# Patient Record
Sex: Female | Born: 1970 | Race: White | Hispanic: No | Marital: Married | Smoking: Current some day smoker
Health system: Southern US, Community
[De-identification: ages and names within clinical notes are randomized; demographics above are authoritative.]

## PROBLEM LIST (undated history)

## (undated) DIAGNOSIS — D649 Anemia, unspecified: Secondary | ICD-10-CM

## (undated) DIAGNOSIS — K209 Esophagitis, unspecified without bleeding: Secondary | ICD-10-CM

## (undated) DIAGNOSIS — K219 Gastro-esophageal reflux disease without esophagitis: Secondary | ICD-10-CM

## (undated) DIAGNOSIS — C50919 Malignant neoplasm of unspecified site of unspecified female breast: Secondary | ICD-10-CM

## (undated) DIAGNOSIS — Z923 Personal history of irradiation: Secondary | ICD-10-CM

## (undated) DIAGNOSIS — F419 Anxiety disorder, unspecified: Secondary | ICD-10-CM

## (undated) HISTORY — PX: UPPER GI ENDOSCOPY: SHX6162

## (undated) HISTORY — PX: WRIST GANGLION EXCISION: SUR520

## (undated) HISTORY — DX: Anemia, unspecified: D64.9

## (undated) HISTORY — DX: Anxiety disorder, unspecified: F41.9

---

## 1999-11-03 ENCOUNTER — Other Ambulatory Visit: Admission: RE | Admit: 1999-11-03 | Discharge: 1999-11-03 | Payer: Self-pay | Admitting: *Deleted

## 1999-12-06 ENCOUNTER — Encounter: Admission: RE | Admit: 1999-12-06 | Discharge: 2000-01-25 | Payer: Self-pay | Admitting: Internal Medicine

## 2000-11-25 ENCOUNTER — Other Ambulatory Visit: Admission: RE | Admit: 2000-11-25 | Discharge: 2000-11-25 | Payer: Self-pay | Admitting: Obstetrics and Gynecology

## 2002-02-11 ENCOUNTER — Other Ambulatory Visit: Admission: RE | Admit: 2002-02-11 | Discharge: 2002-02-11 | Payer: Self-pay | Admitting: Obstetrics and Gynecology

## 2002-11-09 ENCOUNTER — Ambulatory Visit (HOSPITAL_COMMUNITY): Admission: RE | Admit: 2002-11-09 | Discharge: 2002-11-09 | Payer: Self-pay | Admitting: Obstetrics and Gynecology

## 2003-10-27 ENCOUNTER — Encounter: Admission: RE | Admit: 2003-10-27 | Discharge: 2003-10-27 | Payer: Self-pay | Admitting: Family Medicine

## 2004-02-17 ENCOUNTER — Ambulatory Visit: Payer: Self-pay | Admitting: Family Medicine

## 2004-04-25 ENCOUNTER — Ambulatory Visit: Payer: Self-pay | Admitting: Family Medicine

## 2004-07-07 ENCOUNTER — Ambulatory Visit: Payer: Self-pay | Admitting: Family Medicine

## 2004-10-09 ENCOUNTER — Ambulatory Visit: Payer: Self-pay | Admitting: Family Medicine

## 2004-10-16 ENCOUNTER — Encounter: Admission: RE | Admit: 2004-10-16 | Discharge: 2004-10-16 | Payer: Self-pay | Admitting: Family Medicine

## 2004-11-08 ENCOUNTER — Ambulatory Visit (HOSPITAL_COMMUNITY): Admission: RE | Admit: 2004-11-08 | Discharge: 2004-11-08 | Payer: Self-pay | Admitting: *Deleted

## 2005-09-06 ENCOUNTER — Ambulatory Visit: Payer: Self-pay | Admitting: Family Medicine

## 2005-10-24 ENCOUNTER — Ambulatory Visit: Payer: Self-pay | Admitting: Family Medicine

## 2005-10-24 ENCOUNTER — Ambulatory Visit: Payer: Self-pay | Admitting: *Deleted

## 2005-10-24 LAB — CONVERTED CEMR LAB
ALT: 11 units/L (ref 0–40)
AST: 15 units/L (ref 0–37)
Albumin: 4.1 g/dL (ref 3.5–5.2)
Alkaline Phosphatase: 52 units/L (ref 39–117)
Amylase: 63 units/L (ref 27–131)
BUN: 8 mg/dL (ref 6–23)
Basophils Absolute: 0 10*3/uL (ref 0.0–0.1)
Basophils Relative: 0.7 % (ref 0.0–1.0)
CO2: 28 meq/L (ref 19–32)
Calcium: 9.2 mg/dL (ref 8.4–10.5)
Chloride: 104 meq/L (ref 96–112)
Creatinine, Ser: 0.6 mg/dL (ref 0.4–1.2)
Eosinophil percent: 2.5 % (ref 0.0–5.0)
GFR calc non Af Amer: 121 mL/min
Glomerular Filtration Rate, Af Am: 146 mL/min/{1.73_m2}
Glucose, Bld: 86 mg/dL (ref 70–99)
HCT: 36.5 % (ref 36.0–46.0)
Hemoglobin: 12.1 g/dL (ref 12.0–15.0)
Lipase: 23 units/L (ref 11.0–59.0)
Lymphocytes Relative: 37 % (ref 12.0–46.0)
MCHC: 33.2 g/dL (ref 30.0–36.0)
MCV: 91.6 fL (ref 78.0–100.0)
Monocytes Absolute: 0.3 10*3/uL (ref 0.2–0.7)
Monocytes Relative: 5.6 % (ref 3.0–11.0)
Neutro Abs: 2.9 10*3/uL (ref 1.4–7.7)
Neutrophils Relative %: 54.2 % (ref 43.0–77.0)
Platelets: 328 10*3/uL (ref 150–400)
Potassium: 4 meq/L (ref 3.5–5.1)
RBC: 3.99 M/uL (ref 3.87–5.11)
RDW: 12.6 % (ref 11.5–14.6)
Sodium: 137 meq/L (ref 135–145)
Total Bilirubin: 0.7 mg/dL (ref 0.3–1.2)
Total Protein: 6.7 g/dL (ref 6.0–8.3)
WBC: 5.3 10*3/uL (ref 4.5–10.5)

## 2005-12-10 ENCOUNTER — Ambulatory Visit: Payer: Self-pay | Admitting: Family Medicine

## 2006-01-24 ENCOUNTER — Ambulatory Visit: Payer: Self-pay | Admitting: Family Medicine

## 2006-01-24 LAB — CONVERTED CEMR LAB
ALT: 12 units/L (ref 0–40)
AST: 19 units/L (ref 0–37)
Albumin: 4 g/dL (ref 3.5–5.2)
Alkaline Phosphatase: 52 units/L (ref 39–117)
BUN: 8 mg/dL (ref 6–23)
Basophils Absolute: 0.1 10*3/uL (ref 0.0–0.1)
Basophils Relative: 1.8 % — ABNORMAL HIGH (ref 0.0–1.0)
CO2: 29 meq/L (ref 19–32)
Calcium: 9.2 mg/dL (ref 8.4–10.5)
Chloride: 107 meq/L (ref 96–112)
Creatinine, Ser: 0.6 mg/dL (ref 0.4–1.2)
Eosinophils Absolute: 0.1 10*3/uL (ref 0.0–0.6)
Eosinophils Relative: 2.1 % (ref 0.0–5.0)
GFR calc Af Amer: 146 mL/min
GFR calc non Af Amer: 121 mL/min
Glucose, Bld: 78 mg/dL (ref 70–99)
HCT: 36.9 % (ref 36.0–46.0)
Hemoglobin: 12.8 g/dL (ref 12.0–15.0)
Lymphocytes Relative: 36.2 % (ref 12.0–46.0)
MCHC: 34.8 g/dL (ref 30.0–36.0)
MCV: 92 fL (ref 78.0–100.0)
Monocytes Absolute: 0.2 10*3/uL (ref 0.2–0.7)
Monocytes Relative: 3.5 % (ref 3.0–11.0)
Neutro Abs: 2.8 10*3/uL (ref 1.4–7.7)
Neutrophils Relative %: 56.4 % (ref 43.0–77.0)
Platelets: 343 10*3/uL (ref 150–400)
Potassium: 4.7 meq/L (ref 3.5–5.1)
RBC: 4.02 M/uL (ref 3.87–5.11)
RDW: 12.7 % (ref 11.5–14.6)
Sodium: 141 meq/L (ref 135–145)
Total Bilirubin: 0.5 mg/dL (ref 0.3–1.2)
Total Protein: 6.7 g/dL (ref 6.0–8.3)
WBC: 5 10*3/uL (ref 4.5–10.5)

## 2006-08-22 ENCOUNTER — Telehealth (INDEPENDENT_AMBULATORY_CARE_PROVIDER_SITE_OTHER): Payer: Self-pay | Admitting: *Deleted

## 2006-08-23 ENCOUNTER — Ambulatory Visit: Payer: Self-pay | Admitting: Family Medicine

## 2006-08-23 DIAGNOSIS — J019 Acute sinusitis, unspecified: Secondary | ICD-10-CM

## 2006-08-26 ENCOUNTER — Telehealth (INDEPENDENT_AMBULATORY_CARE_PROVIDER_SITE_OTHER): Payer: Self-pay | Admitting: *Deleted

## 2006-09-18 LAB — CONVERTED CEMR LAB: Pap Smear: NORMAL

## 2007-02-20 ENCOUNTER — Telehealth (INDEPENDENT_AMBULATORY_CARE_PROVIDER_SITE_OTHER): Payer: Self-pay | Admitting: *Deleted

## 2007-02-21 ENCOUNTER — Ambulatory Visit: Payer: Self-pay | Admitting: Family Medicine

## 2007-02-21 DIAGNOSIS — R42 Dizziness and giddiness: Secondary | ICD-10-CM | POA: Insufficient documentation

## 2007-02-24 ENCOUNTER — Telehealth: Payer: Self-pay | Admitting: Family Medicine

## 2007-02-24 ENCOUNTER — Encounter (INDEPENDENT_AMBULATORY_CARE_PROVIDER_SITE_OTHER): Payer: Self-pay | Admitting: *Deleted

## 2007-02-24 ENCOUNTER — Ambulatory Visit: Payer: Self-pay | Admitting: Family Medicine

## 2007-02-24 DIAGNOSIS — D518 Other vitamin B12 deficiency anemias: Secondary | ICD-10-CM

## 2007-02-24 LAB — CONVERTED CEMR LAB
BUN: 5 mg/dL — ABNORMAL LOW (ref 6–23)
Basophils Absolute: 0.1 10*3/uL (ref 0.0–0.1)
Basophils Relative: 1.5 % — ABNORMAL HIGH (ref 0.0–1.0)
CO2: 30 meq/L (ref 19–32)
Calcium: 9.2 mg/dL (ref 8.4–10.5)
Chloride: 107 meq/L (ref 96–112)
Creatinine, Ser: 0.6 mg/dL (ref 0.4–1.2)
Eosinophils Absolute: 0.1 10*3/uL (ref 0.0–0.6)
Eosinophils Relative: 2.5 % (ref 0.0–5.0)
Folate: 10.8 ng/mL
GFR calc Af Amer: 145 mL/min
GFR calc non Af Amer: 120 mL/min
Glucose, Bld: 89 mg/dL (ref 70–99)
HCT: 36 % (ref 36.0–46.0)
Hemoglobin: 11.9 g/dL — ABNORMAL LOW (ref 12.0–15.0)
Lymphocytes Relative: 42.4 % (ref 12.0–46.0)
MCHC: 33.1 g/dL (ref 30.0–36.0)
MCV: 92.7 fL (ref 78.0–100.0)
Monocytes Absolute: 0.3 10*3/uL (ref 0.2–0.7)
Monocytes Relative: 5.5 % (ref 3.0–11.0)
Neutro Abs: 2.3 10*3/uL (ref 1.4–7.7)
Neutrophils Relative %: 48.1 % (ref 43.0–77.0)
Platelets: 345 10*3/uL (ref 150–400)
Potassium: 4.2 meq/L (ref 3.5–5.1)
RBC: 3.89 M/uL (ref 3.87–5.11)
RDW: 12.5 % (ref 11.5–14.6)
Sodium: 142 meq/L (ref 135–145)
TSH: 0.8 microintl units/mL (ref 0.35–5.50)
Vitamin B-12: 184 pg/mL — ABNORMAL LOW (ref 211–911)
WBC: 4.8 10*3/uL (ref 4.5–10.5)

## 2007-02-27 ENCOUNTER — Telehealth: Payer: Self-pay | Admitting: Family Medicine

## 2007-03-04 ENCOUNTER — Ambulatory Visit: Payer: Self-pay | Admitting: Family Medicine

## 2007-03-04 DIAGNOSIS — R519 Headache, unspecified: Secondary | ICD-10-CM | POA: Insufficient documentation

## 2007-03-04 DIAGNOSIS — R51 Headache: Secondary | ICD-10-CM

## 2007-03-07 ENCOUNTER — Encounter (INDEPENDENT_AMBULATORY_CARE_PROVIDER_SITE_OTHER): Payer: Self-pay | Admitting: Family Medicine

## 2007-03-08 ENCOUNTER — Encounter: Admission: RE | Admit: 2007-03-08 | Discharge: 2007-03-08 | Payer: Self-pay | Admitting: Family Medicine

## 2007-03-10 ENCOUNTER — Telehealth: Payer: Self-pay | Admitting: Family Medicine

## 2007-03-11 ENCOUNTER — Encounter: Payer: Self-pay | Admitting: Family Medicine

## 2007-03-13 ENCOUNTER — Ambulatory Visit: Payer: Self-pay | Admitting: Family Medicine

## 2007-03-13 ENCOUNTER — Telehealth (INDEPENDENT_AMBULATORY_CARE_PROVIDER_SITE_OTHER): Payer: Self-pay | Admitting: *Deleted

## 2007-03-14 ENCOUNTER — Encounter (INDEPENDENT_AMBULATORY_CARE_PROVIDER_SITE_OTHER): Payer: Self-pay | Admitting: *Deleted

## 2007-03-17 ENCOUNTER — Telehealth (INDEPENDENT_AMBULATORY_CARE_PROVIDER_SITE_OTHER): Payer: Self-pay | Admitting: *Deleted

## 2007-03-17 ENCOUNTER — Encounter (INDEPENDENT_AMBULATORY_CARE_PROVIDER_SITE_OTHER): Payer: Self-pay | Admitting: *Deleted

## 2007-03-18 ENCOUNTER — Telehealth (INDEPENDENT_AMBULATORY_CARE_PROVIDER_SITE_OTHER): Payer: Self-pay | Admitting: *Deleted

## 2007-03-18 ENCOUNTER — Encounter (INDEPENDENT_AMBULATORY_CARE_PROVIDER_SITE_OTHER): Payer: Self-pay | Admitting: *Deleted

## 2007-03-18 ENCOUNTER — Telehealth: Payer: Self-pay | Admitting: Family Medicine

## 2007-03-19 ENCOUNTER — Telehealth (INDEPENDENT_AMBULATORY_CARE_PROVIDER_SITE_OTHER): Payer: Self-pay | Admitting: *Deleted

## 2007-03-24 ENCOUNTER — Ambulatory Visit: Payer: Self-pay | Admitting: Family Medicine

## 2007-03-26 ENCOUNTER — Telehealth (INDEPENDENT_AMBULATORY_CARE_PROVIDER_SITE_OTHER): Payer: Self-pay | Admitting: *Deleted

## 2007-03-27 ENCOUNTER — Encounter (INDEPENDENT_AMBULATORY_CARE_PROVIDER_SITE_OTHER): Payer: Self-pay | Admitting: *Deleted

## 2007-03-27 ENCOUNTER — Telehealth (INDEPENDENT_AMBULATORY_CARE_PROVIDER_SITE_OTHER): Payer: Self-pay | Admitting: *Deleted

## 2007-03-27 LAB — CONVERTED CEMR LAB
Basophils Absolute: 0 10*3/uL (ref 0.0–0.1)
Basophils Relative: 0.1 % (ref 0.0–1.0)
Eosinophils Absolute: 0.2 10*3/uL (ref 0.0–0.6)
Eosinophils Relative: 3.6 % (ref 0.0–5.0)
Ferritin: 23.3 ng/mL (ref 10.0–291.0)
Folate: 9.5 ng/mL
HCT: 37 % (ref 36.0–46.0)
Hemoglobin: 12.2 g/dL (ref 12.0–15.0)
Iron: 99 ug/dL (ref 42–145)
Lymphocytes Relative: 44.9 % (ref 12.0–46.0)
MCHC: 33 g/dL (ref 30.0–36.0)
MCV: 93.1 fL (ref 78.0–100.0)
Monocytes Absolute: 0.2 10*3/uL (ref 0.2–0.7)
Monocytes Relative: 4 % (ref 3.0–11.0)
Neutro Abs: 2.5 10*3/uL (ref 1.4–7.7)
Neutrophils Relative %: 47.4 % (ref 43.0–77.0)
Platelets: 358 10*3/uL (ref 150–400)
RBC: 3.98 M/uL (ref 3.87–5.11)
RDW: 12.9 % (ref 11.5–14.6)
Saturation Ratios: 27.5 % (ref 20.0–50.0)
TSH: 1.16 microintl units/mL (ref 0.35–5.50)
Transferrin: 257.1 mg/dL (ref >=212.0)
Vitamin B-12: 462 pg/mL (ref 211–911)
WBC: 5.2 10*3/uL (ref 4.5–10.5)

## 2007-03-31 ENCOUNTER — Encounter: Payer: Self-pay | Admitting: Family Medicine

## 2007-04-02 ENCOUNTER — Telehealth (INDEPENDENT_AMBULATORY_CARE_PROVIDER_SITE_OTHER): Payer: Self-pay | Admitting: *Deleted

## 2007-04-02 LAB — CONVERTED CEMR LAB
Anti Nuclear Antibody(ANA): NEGATIVE
Vit D, 1,25-Dihydroxy: 18 — ABNORMAL LOW (ref 30–89)

## 2007-04-03 ENCOUNTER — Encounter: Payer: Self-pay | Admitting: Family Medicine

## 2007-04-03 ENCOUNTER — Telehealth: Payer: Self-pay | Admitting: Family Medicine

## 2007-04-08 ENCOUNTER — Encounter: Payer: Self-pay | Admitting: Family Medicine

## 2007-04-10 ENCOUNTER — Ambulatory Visit: Payer: Self-pay | Admitting: Family Medicine

## 2007-04-10 DIAGNOSIS — F411 Generalized anxiety disorder: Secondary | ICD-10-CM | POA: Insufficient documentation

## 2007-04-10 DIAGNOSIS — E559 Vitamin D deficiency, unspecified: Secondary | ICD-10-CM | POA: Insufficient documentation

## 2007-04-29 ENCOUNTER — Ambulatory Visit: Payer: Self-pay | Admitting: Family Medicine

## 2007-05-29 ENCOUNTER — Telehealth (INDEPENDENT_AMBULATORY_CARE_PROVIDER_SITE_OTHER): Payer: Self-pay | Admitting: *Deleted

## 2007-05-30 ENCOUNTER — Ambulatory Visit: Payer: Self-pay | Admitting: Family Medicine

## 2007-06-04 LAB — CONVERTED CEMR LAB: Vitamin B-12: 313 pg/mL (ref 211–911)

## 2007-06-05 ENCOUNTER — Encounter (INDEPENDENT_AMBULATORY_CARE_PROVIDER_SITE_OTHER): Payer: Self-pay | Admitting: *Deleted

## 2007-06-20 ENCOUNTER — Ambulatory Visit: Payer: Self-pay | Admitting: Family Medicine

## 2007-06-24 ENCOUNTER — Telehealth: Payer: Self-pay | Admitting: Family Medicine

## 2007-07-01 ENCOUNTER — Telehealth (INDEPENDENT_AMBULATORY_CARE_PROVIDER_SITE_OTHER): Payer: Self-pay | Admitting: *Deleted

## 2007-08-07 ENCOUNTER — Ambulatory Visit: Payer: Self-pay | Admitting: Family Medicine

## 2007-08-08 ENCOUNTER — Encounter: Payer: Self-pay | Admitting: Family Medicine

## 2007-08-11 ENCOUNTER — Telehealth (INDEPENDENT_AMBULATORY_CARE_PROVIDER_SITE_OTHER): Payer: Self-pay | Admitting: *Deleted

## 2007-08-11 LAB — CONVERTED CEMR LAB: Vit D, 1,25-Dihydroxy: 36 (ref 30–89)

## 2007-08-17 LAB — CONVERTED CEMR LAB
ALT: 14 units/L (ref 0–35)
AST: 19 units/L (ref 0–37)
Albumin: 4.5 g/dL (ref 3.5–5.2)
Alkaline Phosphatase: 58 units/L (ref 39–117)
BUN: 10 mg/dL (ref 6–23)
Basophils Absolute: 0.1 10*3/uL (ref 0.0–0.1)
Basophils Relative: 1.2 % (ref 0.0–3.0)
Bilirubin, Direct: 0.1 mg/dL (ref 0.0–0.3)
CO2: 27 meq/L (ref 19–32)
Calcium: 9.5 mg/dL (ref 8.4–10.5)
Chloride: 103 meq/L (ref 96–112)
Cholesterol: 197 mg/dL (ref 0–200)
Creatinine, Ser: 0.6 mg/dL (ref 0.4–1.2)
Eosinophils Absolute: 0.1 10*3/uL (ref 0.0–0.7)
Eosinophils Relative: 2.4 % (ref 0.0–5.0)
Folate: 13.5 ng/mL
GFR calc Af Amer: 145 mL/min
GFR calc non Af Amer: 120 mL/min
Glucose, Bld: 79 mg/dL (ref 70–99)
HCT: 35.8 % — ABNORMAL LOW (ref 36.0–46.0)
HDL: 49.1 mg/dL (ref >39.0)
Hemoglobin: 12.6 g/dL (ref 12.0–15.0)
LDL Cholesterol: 121 mg/dL — ABNORMAL HIGH (ref 0–99)
Lymphocytes Relative: 39.3 % (ref 12.0–46.0)
MCHC: 35.1 g/dL (ref 30.0–36.0)
MCV: 92.8 fL (ref 78.0–100.0)
Monocytes Absolute: 0.4 10*3/uL (ref 0.1–1.0)
Monocytes Relative: 7.1 % (ref 3.0–12.0)
Neutro Abs: 2.7 10*3/uL (ref 1.4–7.7)
Neutrophils Relative %: 50 % (ref 43.0–77.0)
Platelets: 344 10*3/uL (ref 150–400)
Potassium: 4.1 meq/L (ref 3.5–5.1)
RBC: 3.86 M/uL — ABNORMAL LOW (ref 3.87–5.11)
RDW: 12.6 % (ref 11.5–14.6)
Sodium: 139 meq/L (ref 135–145)
TSH: 1.79 microintl units/mL (ref 0.35–5.50)
Total Bilirubin: 0.9 mg/dL (ref 0.3–1.2)
Total CHOL/HDL Ratio: 4
Total Protein: 7.4 g/dL (ref 6.0–8.3)
Triglycerides: 134 mg/dL (ref 0–149)
VLDL: 27 mg/dL (ref 0–40)
Vitamin B-12: >1500 pg/mL — ABNORMAL HIGH (ref 211–911)
WBC: 5.5 10*3/uL (ref 4.5–10.5)

## 2007-08-18 ENCOUNTER — Encounter (INDEPENDENT_AMBULATORY_CARE_PROVIDER_SITE_OTHER): Payer: Self-pay | Admitting: *Deleted

## 2007-09-19 ENCOUNTER — Ambulatory Visit: Payer: Self-pay | Admitting: Family Medicine

## 2007-10-27 ENCOUNTER — Ambulatory Visit: Payer: Self-pay | Admitting: Family Medicine

## 2007-12-10 ENCOUNTER — Ambulatory Visit: Payer: Self-pay | Admitting: Family Medicine

## 2008-02-16 ENCOUNTER — Ambulatory Visit: Payer: Self-pay | Admitting: Family Medicine

## 2008-04-26 ENCOUNTER — Ambulatory Visit: Payer: Self-pay | Admitting: Family Medicine

## 2008-06-01 ENCOUNTER — Ambulatory Visit: Payer: Self-pay | Admitting: Family Medicine

## 2008-06-15 ENCOUNTER — Ambulatory Visit: Payer: Self-pay | Admitting: Family Medicine

## 2008-06-15 DIAGNOSIS — R1031 Right lower quadrant pain: Secondary | ICD-10-CM

## 2008-06-15 DIAGNOSIS — M533 Sacrococcygeal disorders, not elsewhere classified: Secondary | ICD-10-CM | POA: Insufficient documentation

## 2008-06-16 ENCOUNTER — Telehealth (INDEPENDENT_AMBULATORY_CARE_PROVIDER_SITE_OTHER): Payer: Self-pay | Admitting: *Deleted

## 2008-06-16 LAB — CONVERTED CEMR LAB
ALT: 15 units/L (ref 0–35)
AST: 17 units/L (ref 0–37)
Albumin: 4.2 g/dL (ref 3.5–5.2)
Alkaline Phosphatase: 50 units/L (ref 39–117)
BUN: 10 mg/dL (ref 6–23)
Basophils Absolute: 0 10*3/uL (ref 0.0–0.1)
Basophils Relative: 0.4 % (ref 0.0–3.0)
Bilirubin, Direct: 0.1 mg/dL (ref 0.0–0.3)
CO2: 26 meq/L (ref 19–32)
Calcium: 9 mg/dL (ref 8.4–10.5)
Chloride: 106 meq/L (ref 96–112)
Creatinine, Ser: 0.6 mg/dL (ref 0.4–1.2)
Eosinophils Absolute: 0.1 10*3/uL (ref 0.0–0.7)
Eosinophils Relative: 2.2 % (ref 0.0–5.0)
GFR calc non Af Amer: 119.05 mL/min (ref >60)
Glucose, Bld: 78 mg/dL (ref 70–99)
HCT: 37 % (ref 36.0–46.0)
Hemoglobin: 12.7 g/dL (ref 12.0–15.0)
Lymphocytes Relative: 35.2 % (ref 12.0–46.0)
Lymphs Abs: 2.4 10*3/uL (ref 0.7–4.0)
MCHC: 34.3 g/dL (ref 30.0–36.0)
MCV: 96.3 fL (ref 78.0–100.0)
Monocytes Absolute: 0.5 10*3/uL (ref 0.1–1.0)
Monocytes Relative: 7.3 % (ref 3.0–12.0)
Neutro Abs: 3.8 10*3/uL (ref 1.4–7.7)
Neutrophils Relative %: 54.9 % (ref 43.0–77.0)
Platelets: 255 10*3/uL (ref 150.0–400.0)
Potassium: 4.9 meq/L (ref 3.5–5.1)
RBC: 3.84 M/uL — ABNORMAL LOW (ref 3.87–5.11)
RDW: 12.6 % (ref 11.5–14.6)
Sodium: 138 meq/L (ref 135–145)
Total Bilirubin: 0.7 mg/dL (ref 0.3–1.2)
Total Protein: 7.1 g/dL (ref 6.0–8.3)
WBC: 6.8 10*3/uL (ref 4.5–10.5)

## 2008-07-26 ENCOUNTER — Ambulatory Visit: Payer: Self-pay | Admitting: Family Medicine

## 2008-08-24 ENCOUNTER — Ambulatory Visit: Payer: Self-pay | Admitting: Family Medicine

## 2008-10-18 ENCOUNTER — Telehealth (INDEPENDENT_AMBULATORY_CARE_PROVIDER_SITE_OTHER): Payer: Self-pay | Admitting: *Deleted

## 2008-10-18 ENCOUNTER — Ambulatory Visit: Payer: Self-pay | Admitting: Radiology

## 2008-10-18 ENCOUNTER — Telehealth: Payer: Self-pay | Admitting: Family Medicine

## 2008-10-18 ENCOUNTER — Emergency Department (HOSPITAL_BASED_OUTPATIENT_CLINIC_OR_DEPARTMENT_OTHER): Admission: EM | Admit: 2008-10-18 | Discharge: 2008-10-18 | Payer: Self-pay | Admitting: Emergency Medicine

## 2008-10-19 ENCOUNTER — Ambulatory Visit: Payer: Self-pay | Admitting: Family Medicine

## 2009-01-31 ENCOUNTER — Ambulatory Visit: Payer: Self-pay | Admitting: Family

## 2009-01-31 DIAGNOSIS — H6690 Otitis media, unspecified, unspecified ear: Secondary | ICD-10-CM | POA: Insufficient documentation

## 2009-01-31 LAB — CONVERTED CEMR LAB: Rapid Strep: NEGATIVE

## 2009-03-31 ENCOUNTER — Ambulatory Visit: Payer: Self-pay | Admitting: Family Medicine

## 2009-07-19 ENCOUNTER — Ambulatory Visit: Payer: Self-pay | Admitting: Family Medicine

## 2009-07-29 ENCOUNTER — Ambulatory Visit: Payer: Self-pay | Admitting: Family Medicine

## 2009-07-29 DIAGNOSIS — R040 Epistaxis: Secondary | ICD-10-CM

## 2009-07-29 DIAGNOSIS — R079 Chest pain, unspecified: Secondary | ICD-10-CM | POA: Insufficient documentation

## 2009-08-03 ENCOUNTER — Telehealth (INDEPENDENT_AMBULATORY_CARE_PROVIDER_SITE_OTHER): Payer: Self-pay | Admitting: *Deleted

## 2009-08-04 ENCOUNTER — Ambulatory Visit: Payer: Self-pay | Admitting: Family Medicine

## 2009-08-18 ENCOUNTER — Ambulatory Visit: Payer: Self-pay | Admitting: Family Medicine

## 2009-08-31 ENCOUNTER — Ambulatory Visit: Payer: Self-pay | Admitting: Family Medicine

## 2009-09-15 ENCOUNTER — Ambulatory Visit: Payer: Self-pay | Admitting: Family Medicine

## 2009-09-28 ENCOUNTER — Ambulatory Visit: Payer: Self-pay | Admitting: Family Medicine

## 2009-10-14 ENCOUNTER — Ambulatory Visit: Payer: Self-pay | Admitting: Family Medicine

## 2009-11-02 ENCOUNTER — Ambulatory Visit: Payer: Self-pay | Admitting: Family Medicine

## 2009-11-22 ENCOUNTER — Ambulatory Visit: Payer: Self-pay | Admitting: Family Medicine

## 2009-12-20 ENCOUNTER — Ambulatory Visit: Payer: Self-pay | Admitting: Family Medicine

## 2009-12-23 ENCOUNTER — Telehealth (INDEPENDENT_AMBULATORY_CARE_PROVIDER_SITE_OTHER): Payer: Self-pay | Admitting: *Deleted

## 2009-12-23 LAB — CONVERTED CEMR LAB
Folate: 6 ng/mL
Vitamin B-12: 395 pg/mL (ref 211–911)

## 2010-01-21 ENCOUNTER — Encounter: Payer: Self-pay | Admitting: Obstetrics and Gynecology

## 2010-01-24 ENCOUNTER — Ambulatory Visit
Admission: RE | Admit: 2010-01-24 | Discharge: 2010-01-24 | Payer: Self-pay | Source: Home / Self Care | Attending: Family Medicine | Admitting: Family Medicine

## 2010-01-29 LAB — CONVERTED CEMR LAB
ALT: 21 units/L (ref 0–35)
AST: 20 units/L (ref 0–37)
Albumin: 4.4 g/dL (ref 3.5–5.2)
Alkaline Phosphatase: 48 units/L (ref 39–117)
BUN: 15 mg/dL (ref 6–23)
Basophils Absolute: 0.1 10*3/uL (ref 0.0–0.1)
Basophils Relative: 1.1 % (ref 0.0–3.0)
Bilirubin, Direct: 0.1 mg/dL (ref 0.0–0.3)
CO2: 26 meq/L (ref 19–32)
Calcium: 9.3 mg/dL (ref 8.4–10.5)
Chloride: 104 meq/L (ref 96–112)
Creatinine, Ser: 0.5 mg/dL (ref 0.4–1.2)
Eosinophils Absolute: 0.1 10*3/uL (ref 0.0–0.7)
Eosinophils Relative: 1.7 % (ref 0.0–5.0)
Folate: 7 ng/mL
GFR calc non Af Amer: 136.56 mL/min (ref >60)
Glucose, Bld: 81 mg/dL (ref 70–99)
HCT: 36.1 % (ref 36.0–46.0)
Hemoglobin: 12.4 g/dL (ref 12.0–15.0)
Iron: 104 ug/dL (ref 42–145)
Lymphocytes Relative: 32.3 % (ref 12.0–46.0)
Lymphs Abs: 2.3 10*3/uL (ref 0.7–4.0)
MCHC: 34.5 g/dL (ref 30.0–36.0)
MCV: 96.8 fL (ref 78.0–100.0)
Monocytes Absolute: 0.5 10*3/uL (ref 0.1–1.0)
Monocytes Relative: 6.6 % (ref 3.0–12.0)
Neutro Abs: 4.1 10*3/uL (ref 1.4–7.7)
Neutrophils Relative %: 58.3 % (ref 43.0–77.0)
Platelets: 305 10*3/uL (ref 150.0–400.0)
Potassium: 5 meq/L (ref 3.5–5.1)
RBC: 3.73 M/uL — ABNORMAL LOW (ref 3.87–5.11)
RDW: 13.4 % (ref 11.5–14.6)
Saturation Ratios: 32.1 % (ref 20.0–50.0)
Sed Rate: 7 mm/hr (ref 0–22)
Sodium: 135 meq/L (ref 135–145)
TSH: 1.15 microintl units/mL (ref 0.35–5.50)
Total Bilirubin: 0.7 mg/dL (ref 0.3–1.2)
Total Protein: 7.1 g/dL (ref 6.0–8.3)
Transferrin: 231.3 mg/dL (ref 212.0–360.0)
Vit D, 25-Hydroxy: 33 ng/mL (ref 30–89)
Vitamin B-12: 294 pg/mL (ref 211–911)
WBC: 7 10*3/uL (ref 4.5–10.5)

## 2010-02-02 NOTE — Assessment & Plan Note (Signed)
Summary: b-12/cbs  Nurse Visit   Allergies: No Known Drug Allergies  Medication Administration  Injection # 1:    Medication: Vit B12 1000 mcg    Diagnosis: ANEMIA, VITAMIN B12 DEFICIENCY (ICD-281.1)    Route: IM    Site: L deltoid    Exp Date: 03/02/2011    Lot #: [id number]    Mfr: [employer] Regent    Patient tolerated injection without complications    Given by: Almeta Monas CMA Duncan Dull) (September 16, 2009 8:13 AM)  Orders Added: 1)  Vit B12 1000 mcg [J3420] 2)  Admin of Therapeutic Inj  intramuscular or subcutaneous [45409]

## 2010-02-02 NOTE — Assessment & Plan Note (Signed)
Summary: bad cold or allergies//lch   Vital Signs:  Patient profile:   40 year old female Weight:      129 pounds Temp:     98.3 degrees F oral Pulse rate:   76 / minute Pulse rhythm:   regular BP sitting:   122 / 68  (left arm) Cuff size:   regular  Vitals Entered By: Army Fossa CMA (March 31, 2009 3:54 PM) CC: Pt here for head and chest congestion x 1 week. Has tried Mucinex, Nyquil/Dayquil., URI symptoms   History of Present Illness:       This is a 40 year old woman who presents with URI symptoms.  The symptoms began 1 week ago.  The patient complains of nasal congestion, purulent nasal discharge, sore throat, productive cough, and earache.  The patient denies fever, low-grade fever (<100.5 degrees), fever of 100.5-103 degrees, fever of 103.1-104 degrees, fever to >104 degrees, stiff neck, dyspnea, wheezing, rash, vomiting, diarrhea, use of an antipyretic, and response to antipyretic.  The patient also reports headache.  The patient denies the following risk factors for Strep sinusitis: unilateral facial pain, unilateral nasal discharge, poor response to decongestant, double sickening, tooth pain, Strep exposure, tender adenopathy, and absence of cough.    Current Medications (verified): 1)  Vitamin D 98119 Unit  Caps (Ergocalciferol) .... Take One Capsule Weekly 2)  Xanax 0.25 Mg  Tabs (Alprazolam) .Marland Kitchen.. 1 By Mouth Three Times A Day 3)  Lexapro 10 Mg Tabs (Escitalopram Oxalate) .Marland Kitchen.. 1 By Mouth Once Daily 4)  Augmentin 875-125 Mg Tabs (Amoxicillin-Pot Clavulanate) .Marland Kitchen.. 1 By Mouth Two Times A Day 5)  Cheratussin Ac 100-10 Mg/55ml Syrp (Guaifenesin-Codeine) .Marland Kitchen.. 1-2 Tsp By Mouth At Bedtime 6)  Veramyst 27.5 Mcg/spray Susp (Fluticasone Furoate) .... 2 Sprays Each Nostril Once Daily  Allergies (verified): No Known Drug Allergies  Past History:  Past medical, surgical, family and social histories (including risk factors) reviewed for relevance to current acute and chronic  problems.  Past Medical History: Reviewed history from 04/10/2007 and no changes required. Headache Current Problems:  HEADACHE (ICD-784.0) ANEMIA, VITAMIN B12 DEFICIENCY (ICD-281.1) DIZZINESS (ICD-780.4) SINUSITIS, ACUTE NOS (ICD-461.9) Anxiety  Past Surgical History: Reviewed history from 08/07/2007 and no changes required. IUD ganglion wrist, R  Family History: Reviewed history from 08/07/2007 and no changes required. Family History Diabetes 1st degree relative Family History Hypertension  Social History: Reviewed history from 08/07/2007 and no changes required. Married Never Smoked Drug use-no Occupation:  Set designer Alcohol use-yes Regular exercise-yes  Review of Systems      See HPI  Physical Exam  General:  Well-developed,well-nourished,in no acute distress; alert,appropriate and cooperative throughout examination Ears:  External ear exam shows no significant lesions or deformities.  Otoscopic examination reveals clear canals, tympanic membranes are intact bilaterally without bulging, retraction, inflammation or discharge. Hearing is grossly normal bilaterally. Nose:  L frontal sinus tenderness, L maxillary sinus tenderness, R frontal sinus tenderness, and R maxillary sinus tenderness.   Mouth:  Oral mucosa and oropharynx without lesions or exudates.  Teeth in good repair. Neck:  cervical lymphadenopathy.   Lungs:  Normal respiratory effort, chest expands symmetrically. Lungs are clear to auscultation, no crackles or wheezes. Heart:  Normal rate and regular rhythm. S1 and S2 normal without gallop, murmur, click, rub or other extra sounds. Psych:  Cognition and judgment appear intact. Alert and cooperative with normal attention span and concentration. No apparent delusions, illusions, hallucinations   Impression & Recommendations:  Problem # 1:  SINUSITIS - ACUTE-NOS (ICD-461.9)  Her updated medication list for this problem includes:    Augmentin  875-125 Mg Tabs (Amoxicillin-pot clavulanate) .Marland Kitchen... 1 by mouth two times a day    Cheratussin Ac 100-10 Mg/28ml Syrp (Guaifenesin-codeine) .Marland Kitchen... 1-2 tsp by mouth at bedtime    Veramyst 27.5 Mcg/spray Susp (Fluticasone furoate) .Marland Kitchen... 2 sprays each nostril once daily  Instructed on treatment. Call if symptoms persist or worsen.   Complete Medication List: 1)  Vitamin D 32951 Unit Caps (Ergocalciferol) .... Take one capsule weekly 2)  Xanax 0.25 Mg Tabs (Alprazolam) .Marland Kitchen.. 1 by mouth three times a day 3)  Lexapro 10 Mg Tabs (Escitalopram oxalate) .Marland Kitchen.. 1 by mouth once daily 4)  Augmentin 875-125 Mg Tabs (Amoxicillin-pot clavulanate) .Marland Kitchen.. 1 by mouth two times a day 5)  Cheratussin Ac 100-10 Mg/74ml Syrp (Guaifenesin-codeine) .Marland Kitchen.. 1-2 tsp by mouth at bedtime 6)  Veramyst 27.5 Mcg/spray Susp (Fluticasone furoate) .... 2 sprays each nostril once daily Prescriptions: VERAMYST 27.5 MCG/SPRAY SUSP (FLUTICASONE FUROATE) 2 sprays each nostril once daily  #1 x 0   Entered and Authorized by:   Loreen Freud DO   Signed by:   Loreen Freud DO on 03/31/2009   Method used:   Historical   RxID:   [id number] CHERATUSSIN AC 100-10 MG/5ML SYRP (GUAIFENESIN-CODEINE) 1-2 tsp by mouth at bedtime  #6 oz x 0   Entered and Authorized by:   Loreen Freud DO   Signed by:   Loreen Freud DO on 03/31/2009   Method used:   Print then Give to Patient   RxID:   [id number] AUGMENTIN 875-125 MG TABS (AMOXICILLIN-POT CLAVULANATE) 1 by mouth two times a day  #20 x 0   Entered and Authorized by:   Loreen Freud DO   Signed by:   Loreen Freud DO on 03/31/2009   Method used:   Faxed to ...       [facility]  [address]  [phone] (retail)       [address]       [place], [place]  [address]       Ph: [phone] or [phone]       Fax: [phone]   RxID:   [id number]

## 2010-02-02 NOTE — Assessment & Plan Note (Signed)
Summary: B-12 SHOT///SPH  Nurse Visit   Allergies: No Known Drug Allergies  Medication Administration  Injection # 1:    Medication: Vit B12 1000 mcg    Diagnosis: ANEMIA, VITAMIN B12 DEFICIENCY (ICD-281.1)    Route: IM    Site: R deltoid    Exp Date: 03/02/2011    Lot #: [id number]    Mfr: [employer] Regent    Patient tolerated injection without complications    Given by: Almeta Monas CMA (AAMA) (September 28, 2009 9:01 AM)  Orders Added: 1)  Vit B12 1000 mcg [J3420] 2)  Admin of Therapeutic Inj  intramuscular or subcutaneous [04540]

## 2010-02-02 NOTE — Progress Notes (Signed)
Summary: Results   Phone Note Outgoing Call   Call placed by: Almeta Monas CMA Duncan Dull),  December 23, 2009 2:59 PM Call placed to: Patient Details for Reason: b12---low normal---  con't monthly injections----recheck 3 months  Summary of Call: Left message to call back.... Almeta Monas CMA Duncan Dull)  December 23, 2009 3:00 PM   Follow-up for Phone Call        Discuss with patient, appt scheduled..........Marland KitchenFelecia Deloach CMA  December 23, 2009 3:56 PM

## 2010-02-02 NOTE — Assessment & Plan Note (Signed)
Summary: b-12 shot///sph  Nurse Visit   Allergies: No Known Drug Allergies  Medication Administration  Injection # 1:    Medication: Vit B12 1000 mcg    Diagnosis: ANEMIA, VITAMIN B12 DEFICIENCY (ICD-281.1)    Route: IM    Site: R deltoid    Exp Date: 03/02/2011    Lot #: [id number]    Mfr: [employer] Regent    Patient tolerated injection without complications    Given by: Jeremy Johann CMA (November 02, 2009 4:20 PM)  Orders Added: 1)  Admin of Therapeutic Inj  intramuscular or subcutaneous [96372] 2)  Vit B12 1000 mcg [J3420]

## 2010-02-02 NOTE — Assessment & Plan Note (Signed)
Summary: b-12/cbs  Nurse Visit   Allergies: No Known Drug Allergies  Medication Administration  Injection # 1:    Medication: Vit B12 1000 mcg    Diagnosis: ANEMIA, VITAMIN B12 DEFICIENCY (ICD-281.1)    Route: IM    Site: R deltoid    Exp Date: 03/02/2011    Lot #: [id number]    Mfr: [employer] Regent    Patient tolerated injection without complications    Given by: Almeta Monas CMA (AAMA) (August 18, 2009 9:06 AM)  Orders Added: 1)  Vit B12 1000 mcg [J3420] 2)  Admin of Therapeutic Inj  intramuscular or subcutaneous [16109]

## 2010-02-02 NOTE — Progress Notes (Signed)
Summary: lab result  Phone Note Outgoing Call Call back at Work Phone [phone]   Details for Reason: normal except B12 is still low normal---- we can do injections 2x a month--- she can be taught to do Subcutaneous injections at home so she doesn't have to come in here 2x a month-----  recheck 3 months Summary of Call: left message on machine to call back to office.   Signed by Lucious Groves CMA on 08/02/2009 at 10:07 AM  left message to call office....................Marland KitchenFelecia Deloach CMA  August 03, 2009 1:22 PM   Follow-up for Phone Call        DISCUSS WITH PATIENT, appt schedule...........Marland KitchenFelecia Deloach CMA  August 03, 2009 1:36 PM

## 2010-02-02 NOTE — Assessment & Plan Note (Signed)
Summary: b-12/cbs  Nurse Visit   Vitals Entered By: Almeta Monas CMA Duncan Dull) (August 31, 2009 10:57 AM)  Allergies: No Known Drug Allergies  Medication Administration  Injection # 1:    Medication: Vit B12 1000 mcg    Diagnosis: ANEMIA, VITAMIN B12 DEFICIENCY (ICD-281.1)    Route: IM    Site: L deltoid    Exp Date: 03/02/2011    Lot #: [id number]    Mfr: [employer] Regent  Orders Added: 1)  Vit B12 1000 mcg [J3420]

## 2010-02-02 NOTE — Assessment & Plan Note (Signed)
Summary: 1st monthly B-12 inj//fd  Nurse Visit   Allergies: No Known Drug Allergies  Medication Administration  Injection # 1:    Medication: Vit B12 1000 mcg    Diagnosis: ANEMIA, VITAMIN B12 DEFICIENCY (ICD-281.1)    Route: IM    Site: L deltoid    Exp Date: 03/02/2011    Lot #: [id number]    Mfr: [employer] Regent    Given by: Doristine Devoid CMA (January 24, 2010 9:26 AM)  Orders Added: 1)  Vit B12 1000 mcg [J3420] 2)  Admin of Therapeutic Inj  intramuscular or subcutaneous [96372]   Medication Administration  Injection # 1:    Medication: Vit B12 1000 mcg    Diagnosis: ANEMIA, VITAMIN B12 DEFICIENCY (ICD-281.1)    Route: IM    Site: L deltoid    Exp Date: 03/02/2011    Lot #: [id number]    Mfr: [employer] Regent    Given by: Doristine Devoid CMA (January 24, 2010 9:26 AM)  Orders Added: 1)  Vit B12 1000 mcg [J3420] 2)  Admin of Therapeutic Inj  intramuscular or subcutaneous [62952]

## 2010-02-02 NOTE — Assessment & Plan Note (Signed)
Summary: B-12 SHOT///SPH  Nurse Visit  CC: B-12 inj and flu shot./kb   Allergies: No Known Drug Allergies  Medication Administration  Injection # 1:    Medication: Vit B12 1000 mcg    Diagnosis: ANEMIA, VITAMIN B12 DEFICIENCY (ICD-281.1)    Route: IM    Site: L deltoid    Exp Date: 03/02/2011    Lot #: [id number]    Mfr: [employer] Regent    Patient tolerated injection without complications    Given by: Lucious Groves CMA (October 14, 2009 11:21 AM)  Orders Added: 1)  Admin 1st Vaccine [90471] 2)  Flu Vaccine 102yrs + [16109] 3)  Vit B12 1000 mcg [J3420] 4)  Admin of Therapeutic Inj  intramuscular or subcutaneous [96372]         Flu Vaccine Consent Questions     Do you have a history of severe allergic reactions to this vaccine? no    Any prior history of allergic reactions to egg and/or gelatin? no    Do you have a sensitivity to the preservative Thimersol? no    Do you have a past history of Guillan-Barre Syndrome? no    Do you currently have an acute febrile illness? no    Have you ever had a severe reaction to latex? no    Vaccine information given and explained to patient? yes    Are you currently pregnant? no    Lot Number:[id number]   Exp Date:07/01/2010   Site Given  Right Deltoid IMu

## 2010-02-02 NOTE — Assessment & Plan Note (Signed)
Summary: b12/kn  Nurse Visit   Allergies: No Known Drug Allergies  Medication Administration  Injection # 1:    Medication: Vit B12 1000 mcg    Diagnosis: ANEMIA, VITAMIN B12 DEFICIENCY (ICD-281.1)    Route: IM    Site: R deltoid    Exp Date: 03/02/2011    Lot #: [id number]    Mfr: [employer] Regent    Patient tolerated injection without complications    Given by: Jeremy Johann CMA (July 19, 2009 4:31 PM)  Orders Added: 1)  Admin of Therapeutic Inj  intramuscular or subcutaneous [96372] 2)  Vit B12 1000 mcg [J3420]

## 2010-02-02 NOTE — Assessment & Plan Note (Signed)
Summary: b-12/cbs  Nurse Visit   Allergies: No Known Drug Allergies  Medication Administration  Injection # 1:    Medication: Vit B12 1000 mcg    Diagnosis: ANEMIA, VITAMIN B12 DEFICIENCY (ICD-281.1)    Route: IM    Site: R deltoid    Exp Date: 03/02/2011    Lot #: [id number]    Mfr: [employer] Regent    Patient tolerated injection without complications    Given by: Jeremy Johann CMA (November 22, 2009 4:01 PM)  Orders Added: 1)  Admin of Therapeutic Inj  intramuscular or subcutaneous [96372] 2)  Vit B12 1000 mcg [J3420]

## 2010-02-02 NOTE — Assessment & Plan Note (Signed)
Summary: b-12/cbs  Nurse Visit   Allergies: No Known Drug Allergies  Medication Administration  Injection # 1:    Medication: Vit B12 1000 mcg    Diagnosis: ANEMIA, VITAMIN B12 DEFICIENCY (ICD-281.1)    Route: IM    Site: L deltoid    Exp Date: 03/02/2011    Lot #: [id number]    Mfr: [employer] Regent    Patient tolerated injection without complications    Given by: Jeremy Johann CMA (December 20, 2009 4:15 PM)  Orders Added: 1)  Venipuncture [04540] 2)  Specimen Handling [99000] 3)  TLB-B12 + Folate Pnl [82746_82607-B12/FOL] 4)  Vit B12 1000 mcg [J3420] 5)  Admin of Therapeutic Inj  intramuscular or subcutaneous [98119]

## 2010-02-02 NOTE — Assessment & Plan Note (Signed)
Summary: headache.nose bleed/cbs   Vital Signs:  Patient profile:   40 year old female Height:      64 inches Weight:      129 pounds BMI:     22.22 O2 Sat:      100 % on Room air Temp:     98.6 degrees F oral Pulse rate:   69 / minute BP supine:   100 / 66  (left arm) BP sitting:   90 / 70  (left arm) BP standing:   100 / 72  (left arm)  Vitals Entered By: Jeremy Johann CMA (July 29, 2009 11:46 AM)  O2 Flow:  Room air CC: headaches, nose bleed, dizziness, Headaches Is Patient Diabetic? No Comments orthostatic vital standing:pulse71 o2 100, supine pulse 67, o2 100   History of Present Illness: Pt here c/o dizziness since Monday with headache.    Headaches      This is a 40 year old woman who presents with Headaches.  The symptoms began 5 days ago.  The patient complains of nausea and sinus pressure, but denies vomiting, sweats, tearing of eyes, nasal congestion, sinus pain, photophobia, and phonophobia.  The headache is described as constant.  The patient denies the following high-risk features: fever, neck pain/stiffness, vision loss or change, focal weakness, altered mental status, rash, trauma, pain worse with exertion, new type of headache, age >50 years, immunosuppression, concomitant infection, and anticoagulation use.  Pt states headache is between eyes and top of back of head.  Pt states she can feel her heart beating in her head.  Pt taking advil and phenergan.  Pt also feeling dizzy --- coincides with HA.  She had chest pain one time this week --on Wednesday.  It was sharp stabbin pain L side of chest--it did not radiate anywhere.    No SOB.     Current Medications (verified): 1)  None  Allergies (verified): No Known Drug Allergies  Physical Exam  General:  Well-developed,well-nourished,in no acute distress; alert,appropriate and cooperative throughout examination Eyes:  pupils equal, pupils round, pupils reactive to light, and no injection.   Ears:  External ear  exam shows no significant lesions or deformities.  Otoscopic examination reveals clear canals, tympanic membranes are intact bilaterally without bulging, retraction, inflammation or discharge. Hearing is grossly normal bilaterally. Nose:  + dry blood in Left nostril mucosal erythema.   Mouth:  Oral mucosa and oropharynx without lesions or exudates.  Teeth in good repair. Neck:  No deformities, masses, or tenderness noted. Lungs:  Normal respiratory effort, chest expands symmetrically. Lungs are clear to auscultation, no crackles or wheezes. Heart:  normal rate and no murmur.   Extremities:  No clubbing, cyanosis, edema, or deformity noted with normal full range of motion of all joints.   Psych:  Oriented X3 and normally interactive.     Impression & Recommendations:  Problem # 1:  DIZZINESS (ICD-780.4)  Her updated medication list for this problem includes:    Antivert 25 Mg Tabs (Meclizine hcl) .Marland Kitchen... 1 by mouth qid as needed  Orders: Venipuncture (04540) TLB-B12 + Folate Pnl (98119_14782-N56/OZH) TLB-IBC Pnl (Iron/FE;Transferrin) (83550-IBC) TLB-BMP (Basic Metabolic Panel-BMET) (80048-METABOL) TLB-CBC Platelet - w/Differential (85025-CBCD) TLB-Hepatic/Liver Function Pnl (80076-HEPATIC) TLB-TSH (Thyroid Stimulating Hormone) (84443-TSH) EKG w/ Interpretation (93000)  Problem # 2:  UNSPECIFIED VITAMIN D DEFICIENCY (ICD-268.9)  Orders: T-Vitamin D (25-Hydroxy) (08657-84696) EKG w/ Interpretation (93000)  Problem # 3:  CHEST PAIN UNSPECIFIED (ICD-786.50)  Orders: EKG w/ Interpretation (93000) Venipuncture (29528) TLB-B12 + Folate Pnl (  [phone]) TLB-IBC Pnl (Iron/FE;Transferrin) (83550-IBC) TLB-BMP (Basic Metabolic Panel-BMET) (80048-METABOL) TLB-CBC Platelet - w/Differential (85025-CBCD) TLB-Hepatic/Liver Function Pnl (80076-HEPATIC) TLB-TSH (Thyroid Stimulating Hormone) (84443-TSH) EKG w/ Interpretation (93000)  Problem # 4:  EPISTAXIS (ICD-784.7)  not  actively bleeding if occurs again ---ice/ pressure--can use Afrin if needed Go to ER if unable to stop bleeding if they con't refer to ENT  Orders: EKG w/ Interpretation (93000)  Problem # 5:  ANEMIA, VITAMIN B12 DEFICIENCY (ICD-281.1)  Orders: T-Vitamin D (25-Hydroxy) (96295-28413) EKG w/ Interpretation (93000)  Hgb: 12.7 (06/15/2008)   Hct: 37.0 (06/15/2008)   Platelets: 255.0 (06/15/2008) RBC: 3.84 (06/15/2008)   RDW: 12.6 (06/15/2008)   WBC: 6.8 (06/15/2008) MCV: 96.3 (06/15/2008)   MCHC: 34.3 (06/15/2008) Ferritin: 23.3 (03/24/2007) Iron: 99 (03/24/2007)   % Sat: 27.5 (03/24/2007) B12: > 1500 pg/mL (08/07/2007)   Folate: 13.5 (08/07/2007)   TSH: 1.79 (08/07/2007)  Complete Medication List: 1)  Antivert 25 Mg Tabs (Meclizine hcl) .Marland Kitchen.. 1 by mouth qid as needed  Other Orders: TLB-Sedimentation Rate (ESR) (85652-ESR)  Patient Instructions: 1)  If nose bleed reoccurs and you are unable to stop it with ice, pressure and afrin---go to ER 2)  If they con't we will refer to ent 3)  Use saline spray during day to keep moist and vaseline at night.       Use antivert for dizziness until we get labs back      Prescriptions: ANTIVERT 25 MG TABS (MECLIZINE HCL) 1 by mouth qid as needed  #30 x 0   Entered and Authorized by:   Loreen Freud DO   Signed by:   Loreen Freud DO on 07/29/2009   Method used:   Print then Give to Patient   RxID:   [id number]    EKG  Procedure date:  07/29/2009  Findings:      Normal sinus rhythm with rate of:  61

## 2010-02-02 NOTE — Assessment & Plan Note (Signed)
Summary: cold or sinuses??//lh   Vital Signs:  Patient profile:   40 year old female Weight:      127.8 pounds Temp:     98.3 degrees F oral BP sitting:   114 / 60  (left arm)  Vitals Entered By: Doristine Devoid (January 31, 2009 11:25 AM) CC: sinus congestion and drainge along w/ cough   Primary Care Provider:  Laury Axon  CC:  sinus congestion and drainge along w/ cough.  History of Present Illness: Patient presents today with c/o sore throat and feeling of "liquid in my ears."  Notes that these symptoms started 4 days ago.  Denies fever, has used mucinex/nyquil/theraflu without improvement. Denies nasal congestion of sinus pressure  Allergies: No Known Drug Allergies  Physical Exam  General:  Well-developed,well-nourished,in no acute distress; alert,appropriate and cooperative throughout examination Ears:  R TM red, mild bulging noted.   Mouth:  mild pharyngeal erythema Neck:  No deformities, masses, or tenderness noted. Lungs:  Normal respiratory effort, chest expands symmetrically. Lungs are clear to auscultation, no crackles or wheezes. Heart:  Normal rate and regular rhythm. S1 and S2 normal without gallop, murmur, click, rub or other extra sounds.   Impression & Recommendations:  Problem # 1:  OTITIS MEDIA, ACUTE, RIGHT (ICD-382.9) Assessment New Will plan to treat with amoxicillin.  Rapid strep is negative.  I called prescription in to her pharmacy as I was unable to send electronically.   Her updated medication list for this problem includes:    Amoxicillin 500 Mg Cap (Amoxicillin) .Marland Kitchen... Take 1 capsule by mouth three times a day x 10 days  Orders: Rapid Strep (16109)  Complete Medication List: 1)  Vitamin D 60454 Unit Caps (Ergocalciferol) .... Take one capsule weekly 2)  Xanax 0.25 Mg Tabs (Alprazolam) .Marland Kitchen.. 1 by mouth three times a day 3)  Lexapro 10 Mg Tabs (Escitalopram oxalate) .Marland Kitchen.. 1 by mouth once daily 4)  Amoxicillin 500 Mg Cap (Amoxicillin) .... Take 1  capsule by mouth three times a day x 10 days  Other Orders: Vit B12 1000 mcg (J3420) Admin of Therapeutic Inj  intramuscular or subcutaneous (09811)  Patient Instructions: 1)  Please call if your symptoms worsen or do not improve. 2)  Take 650-1000mg  of Tylenol every 4-6 hours as needed for relief of pain or comfort of fever AVOID taking more than 4000mg   in a 24 hour period (can cause liver damage in higher doses). 3)  You may use chloraseptic spray or lozenges as needed for sore throat.   Prescriptions: AMOXICILLIN 500 MG CAP (AMOXICILLIN) Take 1 capsule by mouth three times a day X 10 days  #30 x 0   Entered and Authorized by:   Lemont Fillers FNP   Signed by:   Lemont Fillers FNP on 01/31/2009   Method used:   Print then Give to Patient   RxID:   [id number] AMOXICILLIN 500 MG CAP (AMOXICILLIN) Take 1 capsule by mouth three times a day X 10 days  #30 x 0   Entered and Authorized by:   Lemont Fillers FNP   Signed by:   Lemont Fillers FNP on 01/31/2009   Method used:   Print then Give to Patient   RxID:   [id number]   Laboratory Results    Other Tests  Rapid Strep: negative    Medication Administration  Injection # 1:    Medication: Vit B12 1000 mcg    Diagnosis: ANEMIA, VITAMIN B12 DEFICIENCY (ICD-281.1)  Route: IM    Site: L deltoid    Exp Date: 10/02/2010    Lot #: [id number]    Mfr: [employer] Regent    Given by: Doristine Devoid (January 31, 2009 12:30 PM)  Orders Added: 1)  Rapid Strep [78295] 2)  Vit B12 1000 mcg [J3420] 3)  Admin of Therapeutic Inj  intramuscular or subcutaneous [96372] 4)  Est. Patient Level III [62130]

## 2010-02-02 NOTE — Assessment & Plan Note (Signed)
Summary: b12 inj/cbs  Nurse Visit   Allergies: No Known Drug Allergies  Medication Administration  Injection # 1:    Medication: Vit B12 1000 mcg    Diagnosis: ANEMIA, VITAMIN B12 DEFICIENCY (ICD-281.1)    Route: IM    Site: L deltoid    Exp Date: 03/02/2011    Lot #: [id number]    Mfr: [employer] Regent    Patient tolerated injection without complications    Given by: Jeremy Johann CMA (August 04, 2009 4:12 PM)  Orders Added: 1)  Vit B12 1000 mcg [J3420] 2)  Admin of Therapeutic Inj  intramuscular or subcutaneous [16109]

## 2010-02-23 ENCOUNTER — Ambulatory Visit: Payer: Self-pay

## 2010-02-27 ENCOUNTER — Ambulatory Visit (INDEPENDENT_AMBULATORY_CARE_PROVIDER_SITE_OTHER): Payer: BC Managed Care – PPO

## 2010-02-27 ENCOUNTER — Encounter: Payer: Self-pay | Admitting: Family Medicine

## 2010-02-27 DIAGNOSIS — D518 Other vitamin B12 deficiency anemias: Secondary | ICD-10-CM

## 2010-03-02 ENCOUNTER — Telehealth: Payer: Self-pay | Admitting: Family Medicine

## 2010-03-09 NOTE — Progress Notes (Signed)
Summary: B-12 level check needed  ---- Converted from flag ---- ---- 02/14/2010 10:03 AM, Lucious Groves CMA wrote: patient due for b-12 level check ------------------------------  I see pt has an upcoming appt and I hadded the lab on in epic.

## 2010-03-09 NOTE — Assessment & Plan Note (Signed)
Summary: b12/kn  Nurse Visit   Allergies: No Known Drug Allergies  Medication Administration  Injection # 1:    Medication: Vit B12 1000 mcg    Diagnosis: ANEMIA, VITAMIN B12 DEFICIENCY (ICD-281.1)    Route: IM    Site: R deltoid    Exp Date: 03/02/2011    Lot #: [id number]    Mfr: [employer]ent    Patient tolerated injection without complications    Given by: Jeremy Johann CMA (February 27, 2010 11:20 AM)  Orders Added: 1)  Admin of Therapeutic Inj  intramuscular or subcutaneous [96372] 2)  Vit B12 1000 mcg [J3420]

## 2010-03-18 ENCOUNTER — Encounter: Payer: Self-pay | Admitting: Family Medicine

## 2010-03-21 ENCOUNTER — Telehealth: Payer: Self-pay | Admitting: Internal Medicine

## 2010-03-21 NOTE — Telephone Encounter (Signed)
Error

## 2010-03-28 ENCOUNTER — Ambulatory Visit: Payer: BC Managed Care – PPO

## 2010-03-29 ENCOUNTER — Ambulatory Visit: Payer: BC Managed Care – PPO

## 2010-03-29 ENCOUNTER — Encounter: Payer: Self-pay | Admitting: Internal Medicine

## 2010-03-29 ENCOUNTER — Ambulatory Visit (INDEPENDENT_AMBULATORY_CARE_PROVIDER_SITE_OTHER): Payer: BC Managed Care – PPO | Admitting: Internal Medicine

## 2010-03-29 VITALS — BP 120/78 | HR 84 | Temp 98.9°F | Wt 132.4 lb

## 2010-03-29 DIAGNOSIS — E538 Deficiency of other specified B group vitamins: Secondary | ICD-10-CM

## 2010-03-29 DIAGNOSIS — J029 Acute pharyngitis, unspecified: Secondary | ICD-10-CM

## 2010-03-29 LAB — VITAMIN B12: Vitamin B-12: >1500 pg/mL — ABNORMAL HIGH (ref 211–911)

## 2010-03-29 LAB — FOLATE: Folate: 6.6 ng/mL (ref >5.9)

## 2010-03-29 LAB — POCT RAPID STREP A (OFFICE): Rapid Strep A Screen: NEGATIVE

## 2010-03-29 MED ORDER — CYANOCOBALAMIN 1000 MCG/ML IJ SOLN
1000.0000 ug | Freq: Once | INTRAMUSCULAR | Status: AC
  Administered 2010-03-29: 1000 ug via INTRAMUSCULAR

## 2010-03-29 MED ORDER — AMOXICILLIN 500 MG PO CAPS: 1000.0000 mg | ORAL_CAPSULE | Freq: Two times a day (BID) | ORAL | Status: AC

## 2010-03-29 NOTE — Patient Instructions (Signed)
Fluids, rest , tylenol or motrin Amoxicillin x 10 days Call if symptoms increase or no better in few day

## 2010-03-29 NOTE — Assessment & Plan Note (Signed)
Acute pharyngitis, viral vs. Strep Rapid test neg Plan: Cx ABX (saw recently a similar case, throat Cx was + thus I like to cover her w/  abx)

## 2010-03-29 NOTE — Progress Notes (Signed)
  Subjective:    Patient ID: Candace Espinoza, female    DOB: 1970-04-26, 40 y.o.   MRN: [id number]  HPI  2 days h/o moderate to severe ST, actually even  the front aspect of the neck is hurting   Review of Systems No fever, ?chills ++ aches, generalized No rash + mild HA Mild dry cough No sinus congestion Past Medical History  Diagnosis Date  . Anxiety   . Anemia     Vitamin B-12 Deficiency   Past Surgical History  Procedure Date  . Intrauterine device insertion   . Wrist ganglion excision     Right       Objective:   Physical Exam  Constitutional: She appears well-developed and well-nourished.  HENT:  Head: Normocephalic and atraumatic.  Right Ear: External ear normal.  Left Ear: External ear normal.  Nose: Nose normal.       Throat symmetric, tonsils normal in size, mild redness at the post pharyngeal wall. Uvula normal. No trismus, no drooling  Neck: Normal range of motion. Neck supple. No tracheal deviation present. No thyromegaly present.       few B LADs ~1cm, tender           Assessment & Plan:

## 2010-03-31 ENCOUNTER — Telehealth: Payer: Self-pay | Admitting: *Deleted

## 2010-03-31 NOTE — Telephone Encounter (Signed)
Left message for pt to call back  °

## 2010-03-31 NOTE — Telephone Encounter (Signed)
Message copied by Army Fossa on Fri Mar 31, 2010  1:26 PM ------      Message from: Willow Ora      Created: Fri Mar 31, 2010  1:01 PM       Advised patient, labs normal

## 2010-03-31 NOTE — Telephone Encounter (Signed)
Discuss with patient  

## 2010-04-03 LAB — THROAT CULTURE: Organism ID, Bacteria: NORMAL

## 2010-04-04 ENCOUNTER — Telehealth: Payer: Self-pay | Admitting: *Deleted

## 2010-04-04 NOTE — Telephone Encounter (Signed)
Message left for patient to return my call.  

## 2010-04-04 NOTE — Telephone Encounter (Signed)
Message copied by Army Fossa on Tue Apr 04, 2010  9:26 AM ------      Message from: Willow Ora      Created: Tue Apr 04, 2010  8:37 AM       Advise patient      Throat culture came back negative, she can stop amoxicillin      All  other labs normal. Good results

## 2010-04-05 NOTE — Telephone Encounter (Signed)
I spoke w/ pt she is aware.  

## 2010-04-06 LAB — DIFFERENTIAL
Basophils Absolute: 0.1 10*3/uL (ref 0.0–0.1)
Basophils Relative: 2 % — ABNORMAL HIGH (ref 0–1)
Eosinophils Absolute: 0.1 10*3/uL (ref 0.0–0.7)
Eosinophils Relative: 1 % (ref 0–5)
Lymphocytes Relative: 17 % (ref 12–46)
Lymphs Abs: 1.4 10*3/uL (ref 0.7–4.0)
Monocytes Absolute: 0.4 10*3/uL (ref 0.1–1.0)
Monocytes Relative: 5 % (ref 3–12)
Neutro Abs: 6.3 10*3/uL (ref 1.7–7.7)
Neutrophils Relative %: 76 % (ref 43–77)

## 2010-04-06 LAB — CBC
HCT: 39.4 % (ref 36.0–46.0)
Hemoglobin: 13.7 g/dL (ref 12.0–15.0)
MCHC: 34.8 g/dL (ref 30.0–36.0)
MCV: 94.8 fL (ref 78.0–100.0)
Platelets: 342 10*3/uL (ref 150–400)
RBC: 4.15 MIL/uL (ref 3.87–5.11)
RDW: 12.2 % (ref 11.5–15.5)
WBC: 8.3 10*3/uL (ref 4.0–10.5)

## 2010-04-06 LAB — POCT CARDIAC MARKERS: Troponin i, poc: <0.05 ng/mL (ref 0.00–0.09)

## 2010-04-06 LAB — COMPREHENSIVE METABOLIC PANEL
ALT: 15 U/L (ref 0–35)
AST: 22 U/L (ref 0–37)
Alkaline Phosphatase: 63 U/L (ref 39–117)
CO2: 25 mEq/L (ref 19–32)
Chloride: 102 mEq/L (ref 96–112)
Creatinine, Ser: 0.7 mg/dL (ref 0.4–1.2)
GFR calc Af Amer: >60 mL/min (ref >60)
GFR calc non Af Amer: >60 mL/min (ref >60)
Potassium: 4.2 mEq/L (ref 3.5–5.1)
Total Bilirubin: 0.6 mg/dL (ref 0.3–1.2)

## 2010-04-11 ENCOUNTER — Other Ambulatory Visit: Payer: Self-pay | Admitting: Certified Nurse Midwife

## 2010-04-13 ENCOUNTER — Telehealth: Payer: Self-pay | Admitting: Family Medicine

## 2010-04-13 MED ORDER — CEFUROXIME AXETIL 500 MG PO TABS: 500.0000 mg | ORAL_TABLET | Freq: Two times a day (BID) | ORAL | Status: AC

## 2010-04-13 NOTE — Telephone Encounter (Signed)
Patient was seen several weeks ago for sore throat - she was given rx for doxycillan she was told to stop taking it because culture was negative for strep - she said sore throat is getting worse -

## 2010-04-13 NOTE — Telephone Encounter (Signed)
Pt still c/o drainage, nasal congestion, loss of taste buds, and sore throat accompany with difficulty swallowing. Pt denies any fever, white or redness on throat.  Pt uses [facility],. Ph: [phone]

## 2010-04-13 NOTE — Telephone Encounter (Signed)
ceftin 500 mg 1 po bid for 10 days ----ov after that if no better

## 2010-04-13 NOTE — Telephone Encounter (Signed)
mssg left advising Rx faxed to pharmacy     KP

## 2010-05-19 NOTE — Letter (Signed)
June 27, 2007    To whom it may concern,   RE:  MAKAYLYNN, BONILLAS  MRN:  [id number]  /  DOB:  1970/08/29   Ms. Simkin came to see me back in February with complaint of  lightheadedness, headaches, dizziness, nausea, that has been going for  couple of days.  At that time, we thought that she had a sinus infection  and treated it as such, but the dizziness continued to not improved.  Blood work was done.  Today she came in.  She was found to be slightly  anemic.  Her B12 was significantly low.  So we started with B12  injections at that time and the patient was told to take over-the-  counter irons.  She returned on March 04, 2007, complaining that she  still had the dizziness and was taking the medication Antivert for it,  but was unable to work, because the dizziness was so severe.  On March 14, 2007, a referral was planned for neurologist because of the  continued dizziness.  Labs were repeated, which were now normal as far  as the anemia and the B12.  ANA was done, Lyme titer, which were all  normal.  MRI was done, which showed no definite abnormality, but as a  results of that a Neurology consult was ordered. The patient returned to  the office on March 24, 2007, complaining of same symptoms, saying that  she was unable to last longer than 10:00 o'clock work because of extreme  dizziness and unable stand or do her normal duties.  We received a phone  call from her disability company stating that her claim was denies  because her blood work was fine when in fact she was being treated for  B12 deficiency and a vitamin D level that was low.  She did go back to  work on April full time, but not because her symptoms are any better,  but because she was afraid of losing her job and not getting paid  secondary to disability being denied.  At that point, she was also being  treated for anxiety with Lexapro and Xanax, and she remains on vitamin D  and getting B12 injections.  Her last blood  work was normal at that time  and the patient set up seeing specialist by this point, although I have  received anything from them to this point.  Again, the patient was being  treated for anxiety, dizziness, vitamin B12 deficiency, vitamin D  deficiency, and it was felt that her  symptoms are too severe to be at work for the time that was out from  February to April.  Although on physical exam, the only abnormality was  her extreme anxiety, which could have been contributing to the dizziness  as well.  The patient seems to be doing better with medications.  Please  feel free to call with any further questions.    Sincerely,      Lelon Perla, DO  Electronically Signed    Shawnie Dapper  DD: 06/27/2007  DT: 06/28/2007  Job #: [id number]

## 2010-05-19 NOTE — Procedures (Signed)
PROCEDURE:  This is a routine EEG.   HISTORY:  This is a 40 year old having episodes of dizziness and headaches.  The patient is having EEG done to evaluate for seizure activity. Her  medications include Elavil and meclizine.   TECHNICAL DESCRIPTION:  Throughout this routine EEG, there was a posterior  dominant rhythm of 10 to 11 hertz activity at 20 to 30 microvolts.  Background activity is symmetric and mostly comprised of alpha range  activity at 15 to 30 microvolts. With photic stimulation, there is a mild  symmetric photic driving response. Hyperventilation did not produce any  significant abnormalities. The patient does not go to sleep during this  recording. Throughout this record, there is no evidence of electrographic  seizures or intraictal discharge activity.   IMPRESSION:  This routine EEG is within normal limits in the awake state.           ______________________________  Bevelyn Buckles. Nash Shearer, M.D.     [id number]  D:  11/08/2004 14:00:28  T:  11/08/2004 15:46:11  Job #:  [id number]

## 2010-06-23 ENCOUNTER — Ambulatory Visit (INDEPENDENT_AMBULATORY_CARE_PROVIDER_SITE_OTHER): Payer: BC Managed Care – PPO | Admitting: Family Medicine

## 2010-06-23 ENCOUNTER — Encounter: Payer: Self-pay | Admitting: Family Medicine

## 2010-06-23 VITALS — BP 102/66 | HR 77 | Temp 98.7°F | Wt 128.2 lb

## 2010-06-23 DIAGNOSIS — E559 Vitamin D deficiency, unspecified: Secondary | ICD-10-CM | POA: Insufficient documentation

## 2010-06-23 DIAGNOSIS — R5383 Other fatigue: Secondary | ICD-10-CM | POA: Insufficient documentation

## 2010-06-23 DIAGNOSIS — R5381 Other malaise: Secondary | ICD-10-CM

## 2010-06-23 LAB — POCT URINALYSIS DIPSTICK
Bilirubin, UA: NEGATIVE
Glucose, UA: NEGATIVE
Ketones, UA: NEGATIVE
Leukocytes, UA: NEGATIVE
Nitrite, UA: NEGATIVE

## 2010-06-23 NOTE — Assessment & Plan Note (Signed)
Check labs 

## 2010-06-23 NOTE — Progress Notes (Signed)
  Subjective:    Patient ID: Candace Espinoza, female    DOB: 1970/08/20, 40 y.o.   MRN: [id number]  HPI Pt here c/o extreme fatigue.  She wants her b12 checked.  Since stopping b12 injections in March the fatigue has been worse. She has also had some muscle aches. Review of Systems    as above Objective:   Physical Exam  Constitutional: She is oriented to person, place, and time. She appears well-developed and well-nourished.  HENT:  Head: Normocephalic.  Right Ear: External ear normal.  Left Ear: External ear normal.  Nose: Nose normal.  Mouth/Throat: Oropharynx is clear and moist. No oropharyngeal exudate.  Eyes: Conjunctivae and EOM are normal. Pupils are equal, round, and reactive to light.  Neck: Normal range of motion. Neck supple.  Cardiovascular: Normal rate, regular rhythm and normal heart sounds.   No murmur heard. Pulmonary/Chest: Effort normal and breath sounds normal.  Musculoskeletal: Normal range of motion. She exhibits no edema and no tenderness.  Neurological: She is alert and oriented to person, place, and time.  Psychiatric: She has a normal mood and affect. Her behavior is normal.          Assessment & Plan:

## 2010-06-23 NOTE — Patient Instructions (Signed)
Fatigue  Fatigue is a feeling of tiredness, lack of energy, lack of motivation, or feeling tired all the time. Having enough rest, good nutrition, and reducing stress will normally reduce fatigue. Consult your caregiver if it persists. The nature of your fatigue will help your caregiver to find out its cause. The treatment is based on the cause.   CAUSES  There are many causes for fatigue. Most of the time, fatigue can be traced to one or more of your habits or routines. Most causes fit into one or more of three general areas. They are:  Lifestyle problems  · Sleep disturbances.  · Overwork.   · Physical exertion.  · Unhealthy habits  · Poor eating habits or eating disorders   · Alcohol and/or drug use   · Lack of proper nutrition (malnutrition).    Psychological problems  · Stress and/or anxiety problems.  · Depression.  · Grief.  · Boredom.    Medical Problems or Conditions  · Anemia.  · Pregnancy.   · Thyroid gland problems.   · Recovery from major surgery.   · Continuous pain.   · Emphysema or asthma that is not well controlled   · Allergic conditions.   · Diabetes.   · Infections (such as mononucleosis).   · Obesity.  · Sleep disorders, such as sleep apnea.  · Heart failure or other heart-related problems.   · Cancer.   · Kidney disease.   · Liver disease.   · Effects of certain medicines such as antihistamines, cough and cold remedies, prescription pain medicines, heart and blood pressure medicines, drugs used for treatment of cancer, and some antidepressants.    SYMPTOMS  The symptoms of fatigue include:   · Lack of energy.  · Lack of drive (motivation).  · Drowsiness.  · Feeling of indifference to the surroundings.    DIAGNOSIS  The details of how you feel help guide your caregiver in finding out what is causing the fatigue. You will be asked about your present and past health condition. It is important to review all medicines that you take, including prescription and non-prescription items. A thorough exam  will be done. You will be questioned about your feelings, habits, and normal lifestyle. Your caregiver may suggest blood tests, urine tests, or other tests to look for common medical causes of fatigue.   TREATMENT  Fatigue is treated by correcting the underlying cause. For example, if you have continuous pain or depression, treating these causes will improve how you feel. Similarly, adjusting the dose of certain medicines will help in reducing fatigue.   HOME CARE INSTRUCTIONS  · Try to get the required amount of good sleep every night.   · Eat a healthy and nutritious diet, and drink enough water throughout the day.   · Practice ways of relaxing (including yoga or meditation).   · Exercise regularly.   · Make plans to change situations that cause stress. Act on those plans so that stresses decrease over time. Keep your work and personal routine reasonable.   · Avoid street drugs and minimize use of alcohol.   · Start taking a daily multivitamin after consulting your caregiver.   SEEK MEDICAL CARE IF:  · You have persistent tiredness, which cannot be accounted for.   · You have fever.   · You have unintentional weight loss.   · You have headaches.   · You have disturbed sleep throughout the night.   · You are feeling sad.   ·   You have constipation.   · You have dry skin.   · You have gained weight.   · You are taking any new or different medicines that you suspect are causing fatigue.   · You are unable to sleep at night.   · You develop any unusual swelling of your legs or other parts of your body.   SEEK IMMEDIATE MEDICAL CARE IF:  · You are feeling confused.   · Your vision is blurred.   · You feel faint or pass out.   · You develop severe headache.   · You develop severe abdominal, pelvic, or back pain.   · You develop chest pain, shortness of breath, or an irregular or fast heartbeat.   · You are unable to pass a normal amount of urine.   · You develop abnormal bleeding such as bleeding from the rectum or you  vomit blood.   · You have thoughts about harming yourself or committing suicide.   · You are worried that you might harm someone else.   MAKE SURE YOU:   · Understand these instructions.   · Will watch your condition.   · Will get help right away if you are not doing well or get worse.   REFERENCES   · National Library of Medicine   http://www.nlm.nih.gov/medlineplus/ency/article/003088.htm  · National Cancer Institute   http://www.cancer.gov/cancertopics/pdq/supportivecare/fatigue/Patient  Document Released: 10/15/2006 Document Re-Released: 12/01/2007  ExitCare® Patient Information ©2011 [employer].

## 2010-06-23 NOTE — Assessment & Plan Note (Signed)
She has been off vita d Check labs

## 2010-06-24 LAB — CBC WITH DIFFERENTIAL/PLATELET
Basophils Absolute: 0.1 10*3/uL (ref 0.0–0.1)
Basophils Relative: 1 % (ref 0–1)
HCT: 38.6 % (ref 36.0–46.0)
Hemoglobin: 12.7 g/dL (ref 12.0–15.0)
Lymphocytes Relative: 23 % (ref 12–46)
MCHC: 32.9 g/dL (ref 30.0–36.0)
Monocytes Absolute: 0.5 10*3/uL (ref 0.1–1.0)
Monocytes Relative: 7 % (ref 3–12)
Neutro Abs: 5 10*3/uL (ref 1.7–7.7)
Neutrophils Relative %: 67 % (ref 43–77)
WBC: 7.5 10*3/uL (ref 4.0–10.5)

## 2010-06-24 LAB — HEPATIC FUNCTION PANEL
ALT: 17 U/L (ref 0–35)
AST: 21 U/L (ref 0–37)
Bilirubin, Direct: 0.1 mg/dL (ref 0.0–0.3)
Indirect Bilirubin: 0.4 mg/dL (ref 0.0–0.9)
Total Bilirubin: 0.5 mg/dL (ref 0.3–1.2)

## 2010-06-24 LAB — VITAMIN B12: Vitamin B-12: 460 pg/mL (ref 211–911)

## 2010-06-24 LAB — BASIC METABOLIC PANEL
Calcium: 9.7 mg/dL (ref 8.4–10.5)
Creat: 0.64 mg/dL (ref 0.50–1.10)
Sodium: 138 mEq/L (ref 135–145)

## 2010-06-24 LAB — TSH: TSH: 1.068 u[IU]/mL (ref 0.350–4.500)

## 2010-06-25 ENCOUNTER — Encounter: Payer: Self-pay | Admitting: Family Medicine

## 2010-06-25 LAB — VITAMIN D 1,25 DIHYDROXY
Vitamin D 1, 25 (OH)2 Total: 42 pg/mL (ref 18–72)
Vitamin D2 1, 25 (OH)2: <8 pg/mL
Vitamin D3 1, 25 (OH)2: 42 pg/mL

## 2010-06-26 ENCOUNTER — Encounter: Payer: Self-pay | Admitting: *Deleted

## 2010-09-13 ENCOUNTER — Other Ambulatory Visit: Payer: Self-pay | Admitting: Obstetrics

## 2010-09-13 DIAGNOSIS — Z1231 Encounter for screening mammogram for malignant neoplasm of breast: Secondary | ICD-10-CM

## 2010-09-26 ENCOUNTER — Ambulatory Visit
Admission: RE | Admit: 2010-09-26 | Discharge: 2010-09-26 | Disposition: A | Discharge status: Home / Self Care | Payer: BC Managed Care – PPO | Source: Ambulatory Visit | Attending: Obstetrics | Admitting: Obstetrics

## 2010-09-26 DIAGNOSIS — Z1231 Encounter for screening mammogram for malignant neoplasm of breast: Secondary | ICD-10-CM

## 2011-07-26 IMAGING — CR DG CHEST 2V
2 series · 2 of 2 positions shown · non-contrast
Comparison: None

CLINICAL DATA: Chest pain

CHEST - 2 VIEW

[w chest pa]
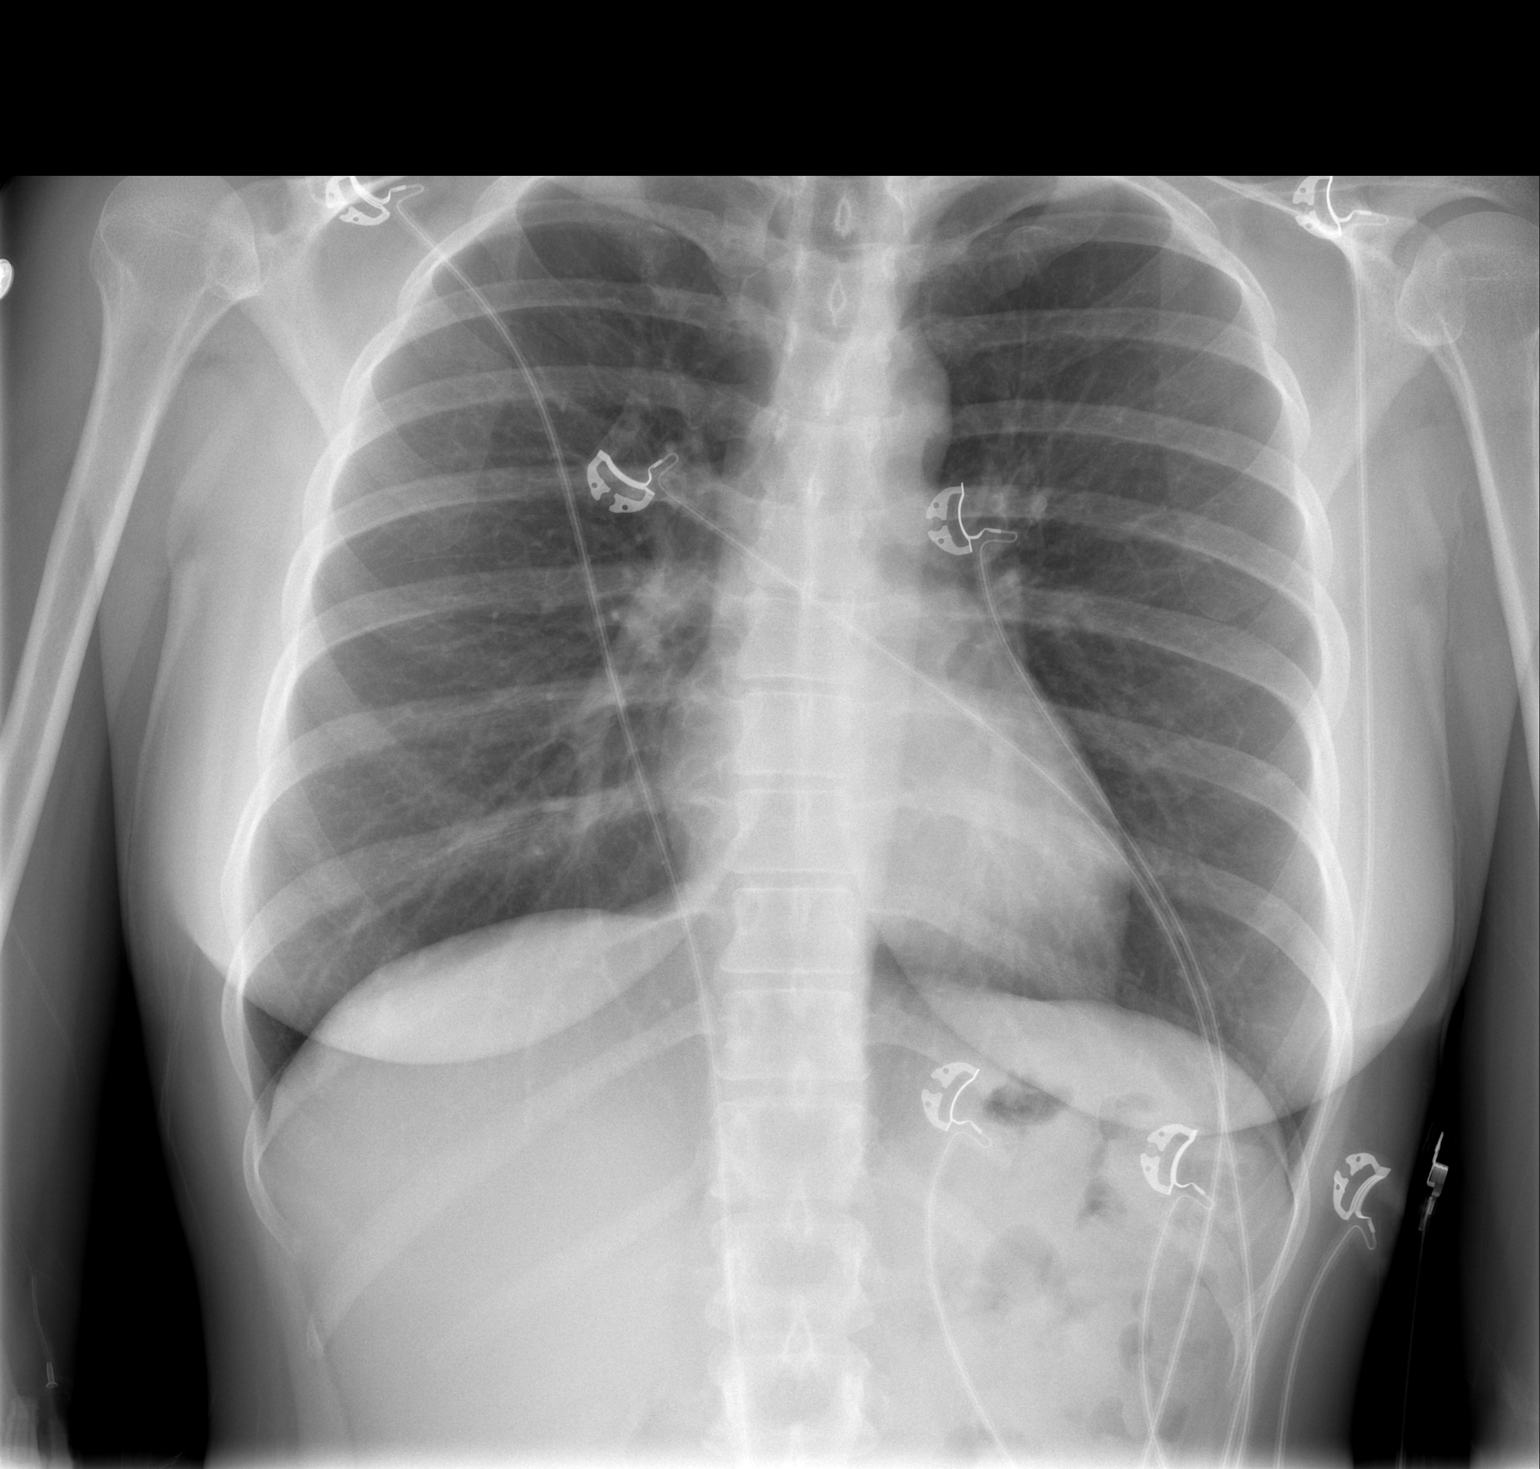

[w chest lat]
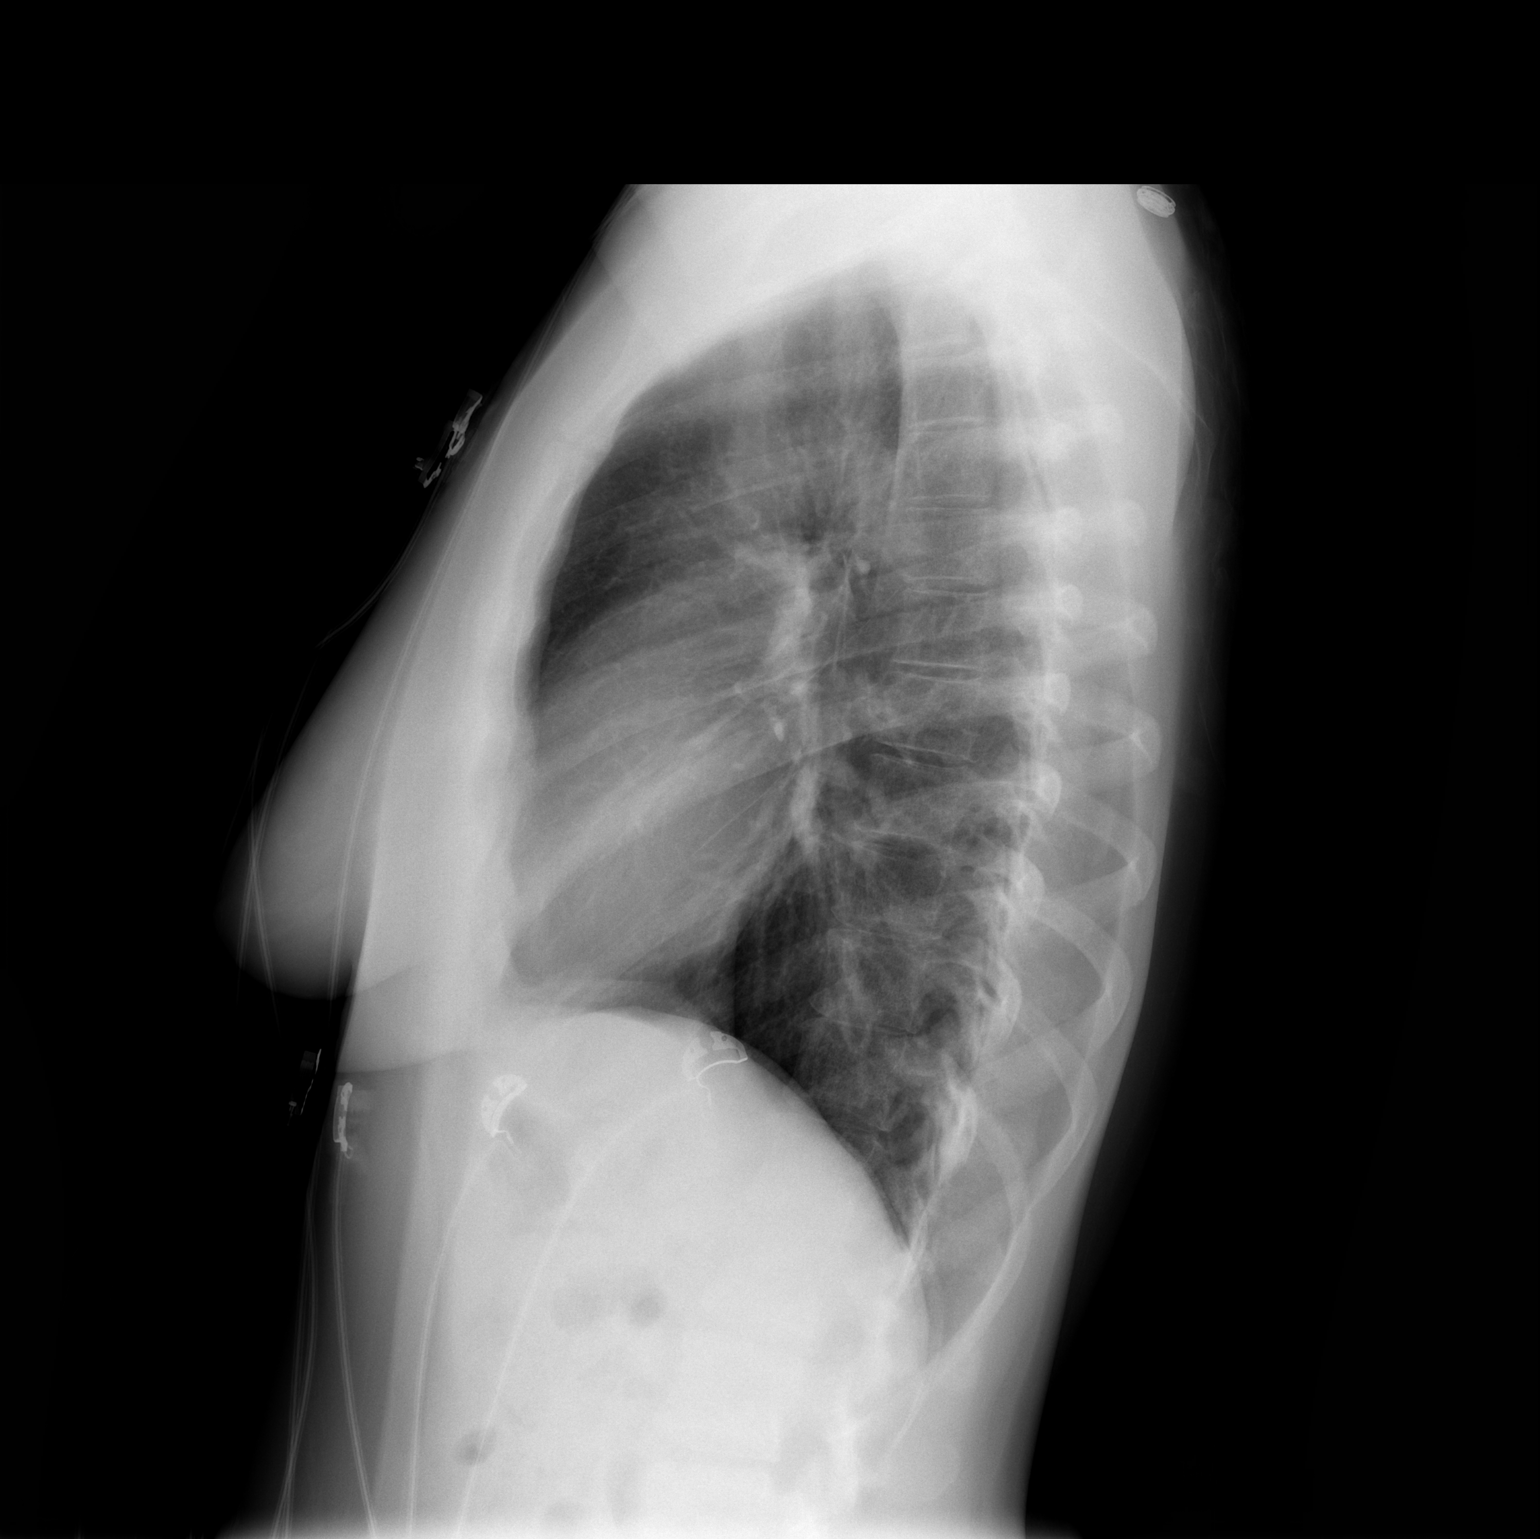

[2 of 2 positions shown; findings below may reference images not displayed]

FINDINGS: Heart and mediastinal contours normal.  Lungs clear.  No
pleural fluid.  Osseous structures and soft tissues unremarkable.
IMPRESSION: No acute or significant findings.

## 2011-10-30 ENCOUNTER — Other Ambulatory Visit: Payer: Self-pay | Admitting: Obstetrics and Gynecology

## 2011-10-30 DIAGNOSIS — E559 Vitamin D deficiency, unspecified: Secondary | ICD-10-CM

## 2011-10-30 DIAGNOSIS — IMO0002 Reserved for concepts with insufficient information to code with codable children: Secondary | ICD-10-CM

## 2011-10-30 DIAGNOSIS — M25559 Pain in unspecified hip: Secondary | ICD-10-CM

## 2011-10-30 DIAGNOSIS — Z1231 Encounter for screening mammogram for malignant neoplasm of breast: Secondary | ICD-10-CM

## 2011-11-13 ENCOUNTER — Ambulatory Visit (INDEPENDENT_AMBULATORY_CARE_PROVIDER_SITE_OTHER): Payer: BC Managed Care – PPO

## 2011-11-13 DIAGNOSIS — IMO0002 Reserved for concepts with insufficient information to code with codable children: Secondary | ICD-10-CM

## 2011-11-13 DIAGNOSIS — M25559 Pain in unspecified hip: Secondary | ICD-10-CM

## 2011-11-15 ENCOUNTER — Ambulatory Visit (INDEPENDENT_AMBULATORY_CARE_PROVIDER_SITE_OTHER): Payer: BC Managed Care – PPO

## 2011-11-15 DIAGNOSIS — Z1231 Encounter for screening mammogram for malignant neoplasm of breast: Secondary | ICD-10-CM

## 2012-03-31 ENCOUNTER — Encounter: Payer: Self-pay | Admitting: Gastroenterology

## 2012-06-03 ENCOUNTER — Encounter: Payer: Self-pay | Admitting: Family Medicine

## 2012-06-03 ENCOUNTER — Ambulatory Visit (INDEPENDENT_AMBULATORY_CARE_PROVIDER_SITE_OTHER): Payer: BC Managed Care – PPO | Admitting: Family Medicine

## 2012-06-03 ENCOUNTER — Ambulatory Visit (HOSPITAL_BASED_OUTPATIENT_CLINIC_OR_DEPARTMENT_OTHER)
Admission: RE | Admit: 2012-06-03 | Discharge: 2012-06-03 | Disposition: A | Discharge status: Home / Self Care | Payer: BC Managed Care – PPO | Source: Ambulatory Visit | Attending: Family Medicine | Admitting: Family Medicine

## 2012-06-03 VITALS — BP 112/70 | HR 75 | Temp 98.6°F | Wt 139.6 lb

## 2012-06-03 DIAGNOSIS — R071 Chest pain on breathing: Secondary | ICD-10-CM

## 2012-06-03 DIAGNOSIS — R0789 Other chest pain: Secondary | ICD-10-CM

## 2012-06-03 DIAGNOSIS — F411 Generalized anxiety disorder: Secondary | ICD-10-CM | POA: Insufficient documentation

## 2012-06-03 DIAGNOSIS — R079 Chest pain, unspecified: Secondary | ICD-10-CM

## 2012-06-03 DIAGNOSIS — R072 Precordial pain: Secondary | ICD-10-CM | POA: Insufficient documentation

## 2012-06-03 DIAGNOSIS — F17200 Nicotine dependence, unspecified, uncomplicated: Secondary | ICD-10-CM | POA: Insufficient documentation

## 2012-06-03 MED ORDER — CYCLOBENZAPRINE HCL 10 MG PO TABS: 10.0000 mg | ORAL_TABLET | Freq: Three times a day (TID) | ORAL | Status: DC | PRN

## 2012-06-03 MED ORDER — TRAMADOL HCL 50 MG PO TABS: 50.0000 mg | ORAL_TABLET | Freq: Three times a day (TID) | ORAL | Status: DC | PRN

## 2012-06-03 NOTE — Patient Instructions (Signed)

## 2012-06-05 ENCOUNTER — Encounter: Payer: Self-pay | Admitting: Family Medicine

## 2012-06-05 NOTE — Progress Notes (Signed)
  Subjective:    Candace Espinoza is a 42 y.o. female who presents for evaluation of chest wall pain. Onset was 2 weeks ago. Symptoms have been unchanged since that time. The patient describes the pain as burning and sharp in the anterior chest wall: mid chest. Patient rates pain as a 6/10 in intensity. Associated symptoms are: chest pain. Aggravating factors are: deep inspiration, palpation of chest. Alleviating factors are: none. Mechanism of injury: none known. Previous visits for this problem: none. Evaluation to date: none. Treatment to date: none.  The following portions of the patient's history were reviewed and updated as appropriate: allergies, current medications, past family history, past medical history, past social history, past surgical history and problem list.  Review of Systems Pertinent items are noted in HPI.   Objective:    BP 112/70  Pulse 75  Temp(Src) 98.6 F (37 C) (Oral)  Wt 139 lb 9.6 oz (63.322 kg)  BMI 23.95 kg/m2  SpO2 99% General appearance: alert, cooperative, appears stated age and no distress Neck: no adenopathy, no carotid bruit, no JVD, supple, symmetrical, trachea midline and thyroid not enlarged, symmetric, no tenderness/mass/nodules Lungs: clear to auscultation bilaterally Heart: S1, S2 normal Extremities: extremities normal, atraumatic, no cyanosis or edema  Imaging Chest x-ray: normal chest x-ray   Assessment:    Chest wall pain   Plan:    Chest wall injuries were discussed.  The sometimes prolonged recovery time was stressed. Follow up as needed muscle relaxer   Consider chiropractor

## 2012-10-28 ENCOUNTER — Ambulatory Visit (INDEPENDENT_AMBULATORY_CARE_PROVIDER_SITE_OTHER): Payer: BC Managed Care – PPO | Admitting: Family Medicine

## 2012-10-28 ENCOUNTER — Encounter: Payer: Self-pay | Admitting: Family Medicine

## 2012-10-28 VITALS — BP 116/70 | HR 68 | Temp 98.2°F | Wt 142.0 lb

## 2012-10-28 DIAGNOSIS — L219 Seborrheic dermatitis, unspecified: Secondary | ICD-10-CM

## 2012-10-28 DIAGNOSIS — L218 Other seborrheic dermatitis: Secondary | ICD-10-CM

## 2012-10-28 DIAGNOSIS — L210 Seborrhea capitis: Secondary | ICD-10-CM | POA: Insufficient documentation

## 2012-10-28 MED ORDER — CICLOPIROX 1 % EX SHAM: MEDICATED_SHAMPOO | CUTANEOUS | Status: DC

## 2012-10-28 NOTE — Progress Notes (Signed)
  Subjective:    Patient ID: Candace Espinoza, female    DOB: 10/01/1970, 42 y.o.   MRN: [id number]  HPI Pt here c/o itchy spot on L side of scalp.  It has been there for years off and on.  She has tried T gel, rx and other otc with no relief or limited relief.    Review of Systems As above    Objective:   Physical Exam  BP 116/70  Pulse 68  Temp(Src) 98.2 F (36.8 C) (Oral)  Wt 142 lb (64.411 kg)  BMI 24.36 kg/m2  SpO2 97% General appearance: alert, cooperative, appears stated age and no distress Head: scalp lesions---+ scaly patch L side of scalp,  flaking       Assessment & Plan:

## 2012-10-28 NOTE — Assessment & Plan Note (Signed)
Ciclopirox shampoo 2x a week for 4 weeks

## 2012-10-28 NOTE — Patient Instructions (Signed)
Seborrheic Dermatitis Seborrheic dermatitis involves pink or red skin with greasy, flaky scales. This is often found on the scalp, eyebrows, nose, bearded area, and on or behind the ears. It can also occur on the central chest. It often occurs where there are more oil (sebaceous) glands. This condition is also known as dandruff. When this condition affects a baby's scalp, it is called cradle cap. It may come and go for no known reason. It can occur at any time of life from infancy to old age. CAUSES  The cause is unknown. It is not the result of too little moisture or too much oil. In some people, seborrheic dermatitis flare-ups seem to be triggered by stress. It also commonly occurs in people with certain diseases such as Parkinson's disease or HIV/AIDS. SYMPTOMS   Thick scales on the scalp.  Redness on the face or in the armpits.  The skin may seem oily or dry, but moisturizers do not help.  In infants, seborrheic dermatitis appears as scaly redness that does not seem to bother the baby. In some babies, it affects only the scalp. In others, it also affects the neck creases, armpits, groin, or behind the ears.  In adults and adolescents, seborrheic dermatitis may affect only the scalp. It may look patchy or spread out, with areas of redness and flaking. Other areas commonly affected include:  Eyebrows.  Eyelids.  Forehead.  Skin behind the ears.  Outer ears.  Chest.  Armpits.  Nose creases.  Skin creases under the breasts.  Skin between the buttocks.  Groin.  Some adults and adolescents feel itching or burning in the affected areas. DIAGNOSIS  Your caregiver can usually tell what the problem is by doing a physical exam. TREATMENT   Cortisone (steroid) ointments, creams, and lotions can help decrease inflammation.  Babies can be treated with baby oil to soften the scales, then they may be washed with baby shampoo. If this does not help, a prescription topical steroid  medicine may work.  Adults can use medicated shampoos.  Your caregiver may prescribe corticosteroid cream and shampoo containing an antifungal or yeast medicine (ketoconazole). Hydrocortisone or anti-yeast cream can be rubbed directly onto seborrheic dermatitis patches. Yeast does not cause seborrheic dermatitis, but it seems to add to the problem. In infants, seborrheic dermatitis is often worst during the first year of life. It tends to disappear on its own as the child grows. However, it may return during the teenage years. In adults and adolescents, seborrheic dermatitis tends to be a long-lasting condition that comes and goes over many years. HOME CARE INSTRUCTIONS   Use prescribed medicines as directed.  In infants, do not aggressively remove the scales or flakes on the scalp with a comb or by other means. This may lead to hair loss. SEEK MEDICAL CARE IF:   The problem does not improve from the medicated shampoos, lotions, or other medicines given by your caregiver.  You have any other questions or concerns. Document Released: 12/18/2004 Document Revised: 06/19/2011 Document Reviewed: 05/09/2009 ExitCare Patient Information 2014 [employer].  

## 2012-12-27 ENCOUNTER — Encounter: Payer: Self-pay | Admitting: Family Medicine

## 2012-12-27 ENCOUNTER — Ambulatory Visit (INDEPENDENT_AMBULATORY_CARE_PROVIDER_SITE_OTHER): Payer: BC Managed Care – PPO | Admitting: Family Medicine

## 2012-12-27 DIAGNOSIS — T6391XA Toxic effect of contact with unspecified venomous animal, accidental (unintentional), initial encounter: Secondary | ICD-10-CM

## 2012-12-27 DIAGNOSIS — T63391A Toxic effect of venom of other spider, accidental (unintentional), initial encounter: Secondary | ICD-10-CM

## 2012-12-27 MED ORDER — DOXYCYCLINE HYCLATE 100 MG PO CAPS: 100.0000 mg | ORAL_CAPSULE | Freq: Two times a day (BID) | ORAL | Status: AC

## 2012-12-27 MED ORDER — FLUCONAZOLE 150 MG PO TABS: 150.0000 mg | ORAL_TABLET | Freq: Once | ORAL | Status: DC

## 2012-12-27 NOTE — Progress Notes (Signed)
Pre visit review using our clinic review tool, if applicable. No additional management support is needed unless otherwise documented below in the visit note. 

## 2012-12-27 NOTE — Progress Notes (Signed)
   Subjective:    Patient ID: Candace Espinoza, female    DOB: Jun 10, 1970, 42 y.o.   MRN: [id number]  HPI Here for 4 weeks of a red tender area on the right hip. She had a red spot there for several weeks that had a central ulcerated area. She treated this with neosporin daily. Then 2 weeks ago the area became painful and swelled a bit. No drainage, no fever. No joint aches.    Review of Systems  Constitutional: Negative.   Musculoskeletal: Negative.   Skin: Positive for wound.  Neurological: Negative.        Objective:   Physical Exam  Constitutional: She appears well-developed and well-nourished. No distress.  Skin:  The right lateral thigh has an area of erythema which has a central ulceration filled with a scab. The area is tender but not warm.           Assessment & Plan:  This appears to be a brown recluse spider bite that has become infected. Treat with Doxycycline and warm compresses.

## 2012-12-30 ENCOUNTER — Telehealth: Payer: Self-pay | Admitting: *Deleted

## 2012-12-30 NOTE — Telephone Encounter (Signed)
Patient states that she will continue the medication and if things are not better on Thursday she will call the office to schedule an appointment on Friday

## 2012-12-30 NOTE — Telephone Encounter (Signed)
Patient states that she was seen at [facility] for a spider bite. Patient was put on Doxycycline 100mg  for 10 days.Patient states that the area is still very painful.patietn would like to know if there was a shot that she can can get that would work faster than the pills. Please advise. SW

## 2012-12-30 NOTE — Telephone Encounter (Signed)
Not really----  Rocephin may help but we would have to see it

## 2013-01-02 ENCOUNTER — Ambulatory Visit: Payer: BC Managed Care – PPO | Admitting: Family Medicine

## 2013-05-18 ENCOUNTER — Ambulatory Visit (INDEPENDENT_AMBULATORY_CARE_PROVIDER_SITE_OTHER): Payer: 59 | Admitting: Internal Medicine

## 2013-05-18 ENCOUNTER — Ambulatory Visit (HOSPITAL_BASED_OUTPATIENT_CLINIC_OR_DEPARTMENT_OTHER)
Admission: RE | Admit: 2013-05-18 | Discharge: 2013-05-18 | Disposition: A | Discharge status: Home / Self Care | Payer: 59 | Source: Ambulatory Visit | Attending: Internal Medicine | Admitting: Internal Medicine

## 2013-05-18 ENCOUNTER — Telehealth: Payer: Self-pay | Admitting: *Deleted

## 2013-05-18 ENCOUNTER — Telehealth: Payer: Self-pay | Admitting: Family Medicine

## 2013-05-18 ENCOUNTER — Encounter: Payer: Self-pay | Admitting: Internal Medicine

## 2013-05-18 VITALS — BP 104/70 | HR 73 | Temp 97.9°F | Wt 142.0 lb

## 2013-05-18 DIAGNOSIS — S20229A Contusion of unspecified back wall of thorax, initial encounter: Secondary | ICD-10-CM

## 2013-05-18 DIAGNOSIS — M4120 Other idiopathic scoliosis, site unspecified: Secondary | ICD-10-CM | POA: Insufficient documentation

## 2013-05-18 DIAGNOSIS — M542 Cervicalgia: Secondary | ICD-10-CM | POA: Insufficient documentation

## 2013-05-18 DIAGNOSIS — R0789 Other chest pain: Secondary | ICD-10-CM

## 2013-05-18 MED ORDER — HYDROCODONE-ACETAMINOPHEN 5-325 MG PO TABS: 1.0000 | ORAL_TABLET | Freq: Three times a day (TID) | ORAL | Status: DC | PRN

## 2013-05-18 MED ORDER — MELOXICAM 15 MG PO TABS: 15.0000 mg | ORAL_TABLET | Freq: Every day | ORAL | Status: DC | PRN

## 2013-05-18 MED ORDER — CYCLOBENZAPRINE HCL 10 MG PO TABS: 10.0000 mg | ORAL_TABLET | Freq: Three times a day (TID) | ORAL | Status: DC | PRN

## 2013-05-18 NOTE — Telephone Encounter (Signed)
Spoke with patient and made aware that Xray results were normal.

## 2013-05-18 NOTE — Progress Notes (Signed)
Pre-visit discussion using our clinic review tool. No additional management support is needed unless otherwise documented below in the visit note.  

## 2013-05-18 NOTE — Patient Instructions (Signed)
Get the XR at [facility], corner of [address] (10 minutes form here); they are open 24/7 [address]  [place], [place] [address] [phone]   take meloxicam one tablet daily as needed for pain ; don't take Motrin if you take meloxicam. Vicodin if the pain continue, will cause drowsiness Flexeril as needed for muscle spasms Call me in few days if not gradually improving

## 2013-05-18 NOTE — Telephone Encounter (Signed)
Relevant patient education mailed to patient.  

## 2013-05-18 NOTE — Telephone Encounter (Signed)
Message copied by Baldwin JamaicaJOHNSON, Meiya Wisler G on Mon May 18, 2013  4:27 PM ------      Message from: Willow OraPAZ, JOSE E      Created: Mon May 18, 2013  1:00 PM       Advise patient, x-ray is okay ------
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
## 2013-06-02 ENCOUNTER — Other Ambulatory Visit: Payer: Self-pay | Admitting: Family Medicine

## 2013-06-02 ENCOUNTER — Telehealth: Payer: Self-pay | Admitting: Family Medicine

## 2013-06-02 DIAGNOSIS — M4180 Other forms of scoliosis, site unspecified: Secondary | ICD-10-CM

## 2013-06-02 DIAGNOSIS — M549 Dorsalgia, unspecified: Secondary | ICD-10-CM

## 2013-06-02 NOTE — Telephone Encounter (Signed)
Caller name: Lilybeth Relation to pt: Call back [phone]  Reason for call:  last OV5/18 back pain...the patient states that she is still having pain and wants a referral to an orthopedic.  Prefers one in [place].

## 2013-06-02 NOTE — Telephone Encounter (Signed)
Last seen with Dr.Paz on 05/18/13.   X-ray  IMPRESSION:  No acute fracture or subluxation.  Mild lower thoracic dextroscoliosis.  Please advise id referral appropriate.     KP

## 2013-06-02 NOTE — Telephone Encounter (Signed)
Ok to put in referral?  

## 2013-06-02 NOTE — Telephone Encounter (Signed)
Patient prefers an Ortho in [place].     KP

## 2013-07-29 ENCOUNTER — Encounter: Payer: Self-pay | Admitting: Gastroenterology

## 2013-12-03 ENCOUNTER — Encounter: Payer: Self-pay | Admitting: Family Medicine

## 2013-12-03 ENCOUNTER — Ambulatory Visit (INDEPENDENT_AMBULATORY_CARE_PROVIDER_SITE_OTHER): Payer: BC Managed Care – PPO | Admitting: Family Medicine

## 2013-12-03 VITALS — BP 117/81 | HR 76 | Temp 98.1°F | Wt 144.6 lb

## 2013-12-03 DIAGNOSIS — F4310 Post-traumatic stress disorder, unspecified: Secondary | ICD-10-CM

## 2013-12-03 DIAGNOSIS — G44311 Acute post-traumatic headache, intractable: Secondary | ICD-10-CM

## 2013-12-03 DIAGNOSIS — S161XXA Strain of muscle, fascia and tendon at neck level, initial encounter: Secondary | ICD-10-CM

## 2013-12-03 MED ORDER — HYDROCODONE-ACETAMINOPHEN 5-325 MG PO TABS: 1.0000 | ORAL_TABLET | Freq: Four times a day (QID) | ORAL | Status: DC | PRN

## 2013-12-03 MED ORDER — CYCLOBENZAPRINE HCL 10 MG PO TABS: 10.0000 mg | ORAL_TABLET | Freq: Three times a day (TID) | ORAL | Status: DC | PRN

## 2013-12-03 MED ORDER — ALPRAZOLAM 0.25 MG PO TABS: 0.2500 mg | ORAL_TABLET | Freq: Three times a day (TID) | ORAL | Status: DC | PRN

## 2013-12-03 NOTE — Progress Notes (Signed)
Pre visit review using our clinic review tool, if applicable. No additional management support is needed unless otherwise documented below in the visit note. 
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
## 2013-12-03 NOTE — Patient Instructions (Signed)

## 2013-12-11 ENCOUNTER — Ambulatory Visit (INDEPENDENT_AMBULATORY_CARE_PROVIDER_SITE_OTHER): Payer: BC Managed Care – PPO | Admitting: Family Medicine

## 2013-12-11 ENCOUNTER — Encounter: Payer: Self-pay | Admitting: Family Medicine

## 2013-12-11 VITALS — BP 110/70 | HR 93 | Temp 98.3°F | Wt 142.8 lb

## 2013-12-11 DIAGNOSIS — G44319 Acute post-traumatic headache, not intractable: Secondary | ICD-10-CM

## 2013-12-11 DIAGNOSIS — R42 Dizziness and giddiness: Secondary | ICD-10-CM

## 2013-12-11 MED ORDER — MECLIZINE HCL 25 MG PO TABS: 25.0000 mg | ORAL_TABLET | Freq: Three times a day (TID) | ORAL | Status: AC | PRN

## 2013-12-11 NOTE — Progress Notes (Signed)
Pre visit review using our clinic review tool, if applicable. No additional management support is needed unless otherwise documented below in the visit note. 

## 2013-12-11 NOTE — Patient Instructions (Signed)
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
## 2013-12-19 ENCOUNTER — Ambulatory Visit (HOSPITAL_BASED_OUTPATIENT_CLINIC_OR_DEPARTMENT_OTHER)
Admission: RE | Admit: 2013-12-19 | Discharge: 2013-12-19 | Disposition: A | Discharge status: Home / Self Care | Payer: BC Managed Care – PPO | Source: Ambulatory Visit | Attending: Family Medicine | Admitting: Family Medicine

## 2013-12-19 DIAGNOSIS — R51 Headache: Secondary | ICD-10-CM | POA: Insufficient documentation

## 2013-12-19 DIAGNOSIS — G44319 Acute post-traumatic headache, not intractable: Secondary | ICD-10-CM

## 2013-12-19 DIAGNOSIS — R42 Dizziness and giddiness: Secondary | ICD-10-CM | POA: Insufficient documentation

## 2013-12-21 ENCOUNTER — Ambulatory Visit (INDEPENDENT_AMBULATORY_CARE_PROVIDER_SITE_OTHER): Payer: BC Managed Care – PPO | Admitting: Psychology

## 2013-12-21 ENCOUNTER — Telehealth: Payer: Self-pay

## 2013-12-21 DIAGNOSIS — F4322 Adjustment disorder with anxiety: Secondary | ICD-10-CM

## 2013-12-21 DIAGNOSIS — F4310 Post-traumatic stress disorder, unspecified: Secondary | ICD-10-CM

## 2013-12-21 DIAGNOSIS — R51 Headache: Secondary | ICD-10-CM

## 2013-12-21 DIAGNOSIS — R519 Headache, unspecified: Secondary | ICD-10-CM

## 2013-12-21 DIAGNOSIS — R42 Dizziness and giddiness: Secondary | ICD-10-CM

## 2013-12-21 DIAGNOSIS — S161XXA Strain of muscle, fascia and tendon at neck level, initial encounter: Secondary | ICD-10-CM

## 2013-12-21 MED ORDER — ALPRAZOLAM 0.25 MG PO TABS: 0.2500 mg | ORAL_TABLET | Freq: Three times a day (TID) | ORAL | Status: DC | PRN

## 2013-12-21 MED ORDER — CYCLOBENZAPRINE HCL 10 MG PO TABS: 10.0000 mg | ORAL_TABLET | Freq: Three times a day (TID) | ORAL | Status: AC | PRN

## 2013-12-21 NOTE — Telephone Encounter (Signed)
Refill both x1  Refer to neuro for headaches and dizziness

## 2013-12-21 NOTE — Telephone Encounter (Signed)
message left advising Rx faxed and Neurology referral pending.      KP

## 2013-12-21 NOTE — Telephone Encounter (Signed)
Results of MRI reviewed with patient.  During call, pt asked what the next steps were because she continues to experience headaches and dizziness. She also requested a refill xanax and flexeril.   Xanax Last filled:  12/03/13 Amt:  30, 0 refills  Cyclobenzaprine Last filled: 12/03/13 Amt:  30, 0 refills  Last seen on 12/11/13   Please advise.

## 2014-01-15 ENCOUNTER — Ambulatory Visit: Payer: Self-pay | Admitting: Psychology

## 2014-02-01 ENCOUNTER — Telehealth: Payer: Self-pay | Admitting: Family Medicine

## 2014-02-01 DIAGNOSIS — F4310 Post-traumatic stress disorder, unspecified: Secondary | ICD-10-CM

## 2014-02-01 MED ORDER — ALPRAZOLAM 0.25 MG PO TABS: 0.2500 mg | ORAL_TABLET | Freq: Three times a day (TID) | ORAL | Status: AC | PRN

## 2014-02-01 NOTE — Telephone Encounter (Signed)
Caller name:Chopra, [place] Relation to GN:FAOZpt:self Call back number:[phone] Pharmacy:  Reason for call: pt is needing rx ALPRAZolam (XANAX) 0.25 MG tablet  Please call when available for pick up

## 2014-02-01 NOTE — Telephone Encounter (Signed)
Refill x1  2 refills Need contract and uds

## 2014-02-01 NOTE — Telephone Encounter (Signed)
VM left advising Rx is ready for pick up in the office     KP

## 2014-02-01 NOTE — Telephone Encounter (Signed)
Last seen 12/11/13 and filled 12/21/13 #30 No UDS and No contract   Please advise     KP

## 2014-02-09 ENCOUNTER — Telehealth: Payer: Self-pay | Admitting: Neurology

## 2014-02-09 NOTE — Telephone Encounter (Signed)
Pt canceled appt to see Dr Everlena CooperJaffe on 02-12-14 and did not resch and i notified the referring Dr office as well

## 2014-02-12 ENCOUNTER — Ambulatory Visit: Payer: Self-pay | Admitting: Neurology

## 2014-05-20 ENCOUNTER — Telehealth: Payer: Self-pay | Admitting: Family Medicine

## 2014-05-20 NOTE — Telephone Encounter (Signed)
Xanax 0.25 02-01-2013 rx not picked up shredded

## 2014-11-23 ENCOUNTER — Encounter (HOSPITAL_COMMUNITY): Payer: Self-pay

## 2014-11-23 ENCOUNTER — Other Ambulatory Visit: Payer: Self-pay | Admitting: Obstetrics & Gynecology

## 2014-11-24 ENCOUNTER — Ambulatory Visit (HOSPITAL_COMMUNITY)
Admission: RE | Admit: 2014-11-24 | Discharge: 2014-11-24 | Disposition: A | Discharge status: Home / Self Care | Payer: Managed Care, Other (non HMO) | Source: Ambulatory Visit | Attending: Obstetrics & Gynecology | Admitting: Obstetrics & Gynecology

## 2014-11-24 ENCOUNTER — Encounter (HOSPITAL_COMMUNITY)
Admission: RE | Disposition: A | Discharge status: Home / Self Care | Payer: Self-pay | Source: Ambulatory Visit | Attending: Obstetrics & Gynecology

## 2014-11-24 ENCOUNTER — Ambulatory Visit (HOSPITAL_COMMUNITY): Payer: Managed Care, Other (non HMO) | Admitting: Anesthesiology

## 2014-11-24 ENCOUNTER — Encounter (HOSPITAL_COMMUNITY): Payer: Self-pay | Admitting: *Deleted

## 2014-11-24 DIAGNOSIS — E538 Deficiency of other specified B group vitamins: Secondary | ICD-10-CM | POA: Diagnosis not present

## 2014-11-24 DIAGNOSIS — S3769XA Other injury of uterus, initial encounter: Secondary | ICD-10-CM | POA: Insufficient documentation

## 2014-11-24 DIAGNOSIS — F419 Anxiety disorder, unspecified: Secondary | ICD-10-CM | POA: Insufficient documentation

## 2014-11-24 DIAGNOSIS — F17210 Nicotine dependence, cigarettes, uncomplicated: Secondary | ICD-10-CM | POA: Insufficient documentation

## 2014-11-24 DIAGNOSIS — Y998 Other external cause status: Secondary | ICD-10-CM | POA: Diagnosis not present

## 2014-11-24 DIAGNOSIS — Z881 Allergy status to other antibiotic agents status: Secondary | ICD-10-CM | POA: Diagnosis not present

## 2014-11-24 DIAGNOSIS — K219 Gastro-esophageal reflux disease without esophagitis: Secondary | ICD-10-CM | POA: Insufficient documentation

## 2014-11-24 DIAGNOSIS — N920 Excessive and frequent menstruation with regular cycle: Secondary | ICD-10-CM | POA: Diagnosis not present

## 2014-11-24 DIAGNOSIS — Y9289 Other specified places as the place of occurrence of the external cause: Secondary | ICD-10-CM | POA: Diagnosis not present

## 2014-11-24 DIAGNOSIS — D649 Anemia, unspecified: Secondary | ICD-10-CM | POA: Insufficient documentation

## 2014-11-24 DIAGNOSIS — Y9389 Activity, other specified: Secondary | ICD-10-CM | POA: Insufficient documentation

## 2014-11-24 DIAGNOSIS — Z885 Allergy status to narcotic agent status: Secondary | ICD-10-CM | POA: Insufficient documentation

## 2014-11-24 DIAGNOSIS — X58XXXA Exposure to other specified factors, initial encounter: Secondary | ICD-10-CM | POA: Insufficient documentation

## 2014-11-24 HISTORY — DX: Esophagitis, unspecified: K20.9

## 2014-11-24 HISTORY — DX: Esophagitis, unspecified without bleeding: K20.90

## 2014-11-24 HISTORY — DX: Gastro-esophageal reflux disease without esophagitis: K21.9

## 2014-11-24 HISTORY — PX: DILITATION & CURRETTAGE/HYSTROSCOPY WITH NOVASURE ABLATION: SHX5568

## 2014-11-24 LAB — CBC
HCT: 31.3 % — ABNORMAL LOW (ref 36.0–46.0)
HEMATOCRIT: 39.3 % (ref 36.0–46.0)
HEMATOCRIT: 34.3 % — AB (ref 36.0–46.0)
Hemoglobin: 13.1 g/dL (ref 12.0–15.0)
Hemoglobin: 10.6 g/dL — ABNORMAL LOW (ref 12.0–15.0)
Hemoglobin: 11.5 g/dL — ABNORMAL LOW (ref 12.0–15.0)
MCH: 31.3 pg (ref 26.0–34.0)
MCH: 31.6 pg (ref 26.0–34.0)
MCH: 31.4 pg (ref 26.0–34.0)
MCHC: 33.3 g/dL (ref 30.0–36.0)
MCHC: 33.9 g/dL (ref 30.0–36.0)
MCHC: 33.5 g/dL (ref 30.0–36.0)
MCV: 93.8 fL (ref 78.0–100.0)
MCV: 93.4 fL (ref 78.0–100.0)
MCV: 93.7 fL (ref 78.0–100.0)
PLATELETS: 292 10*3/uL (ref 150–400)
Platelets: 340 10*3/uL (ref 150–400)
Platelets: 305 10*3/uL (ref 150–400)
RBC: 4.19 MIL/uL (ref 3.87–5.11)
RBC: 3.35 MIL/uL — ABNORMAL LOW (ref 3.87–5.11)
RBC: 3.66 MIL/uL — ABNORMAL LOW (ref 3.87–5.11)
RDW: 14.3 % (ref 11.5–15.5)
RDW: 14.2 % (ref 11.5–15.5)
RDW: 14.2 % (ref 11.5–15.5)
WBC: 6.7 10*3/uL (ref 4.0–10.5)
WBC: 7.9 10*3/uL (ref 4.0–10.5)
WBC: 8.3 10*3/uL (ref 4.0–10.5)

## 2014-11-24 LAB — PREGNANCY, URINE: Preg Test, Ur: NEGATIVE

## 2014-11-24 SURGERY — DILATATION & CURETTAGE/HYSTEROSCOPY WITH NOVASURE ABLATION
Anesthesia: General | Site: Vagina

## 2014-11-24 MED ORDER — CEFAZOLIN SODIUM-DEXTROSE 2-3 GM-% IV SOLR
INTRAVENOUS | Status: AC
  Filled 2014-11-24: qty 50

## 2014-11-24 MED ORDER — PROPOFOL 10 MG/ML IV BOLUS
INTRAVENOUS | Status: DC | PRN
  Administered 2014-11-24: 200 mg via INTRAVENOUS

## 2014-11-24 MED ORDER — DEXAMETHASONE SODIUM PHOSPHATE 4 MG/ML IJ SOLN
INTRAMUSCULAR | Status: DC | PRN
  Administered 2014-11-24: 10 mg via INTRAVENOUS

## 2014-11-24 MED ORDER — OXYCODONE-ACETAMINOPHEN 7.5-325 MG PO TABS: 1.0000 | ORAL_TABLET | ORAL | Status: DC | PRN

## 2014-11-24 MED ORDER — ACETAMINOPHEN 160 MG/5ML PO SOLN
650.0000 mg | Freq: Once | ORAL | Status: AC
  Administered 2014-11-24: 650 mg via ORAL

## 2014-11-24 MED ORDER — FENTANYL CITRATE (PF) 100 MCG/2ML IJ SOLN
INTRAMUSCULAR | Status: AC
  Filled 2014-11-24: qty 2

## 2014-11-24 MED ORDER — KETOROLAC TROMETHAMINE 30 MG/ML IJ SOLN
INTRAMUSCULAR | Status: AC
  Filled 2014-11-24: qty 1

## 2014-11-24 MED ORDER — MIDAZOLAM HCL 5 MG/5ML IJ SOLN
INTRAMUSCULAR | Status: DC | PRN
  Administered 2014-11-24: 2 mg via INTRAVENOUS

## 2014-11-24 MED ORDER — LIDOCAINE HCL (CARDIAC) 20 MG/ML IV SOLN
INTRAVENOUS | Status: AC
  Filled 2014-11-24: qty 5

## 2014-11-24 MED ORDER — DEXAMETHASONE SODIUM PHOSPHATE 10 MG/ML IJ SOLN
INTRAMUSCULAR | Status: AC
  Filled 2014-11-24: qty 1

## 2014-11-24 MED ORDER — SCOPOLAMINE 1 MG/3DAYS TD PT72
1.0000 | MEDICATED_PATCH | Freq: Once | TRANSDERMAL | Status: DC
Start: 2014-11-24 — End: 2014-11-24
  Administered 2014-11-24: 1.5 mg via TRANSDERMAL

## 2014-11-24 MED ORDER — METOCLOPRAMIDE HCL 5 MG/ML IJ SOLN: 10.0000 mg | Freq: Once | INTRAMUSCULAR | Status: DC | PRN

## 2014-11-24 MED ORDER — SCOPOLAMINE 1 MG/3DAYS TD PT72
MEDICATED_PATCH | TRANSDERMAL | Status: AC
  Administered 2014-11-24: 1.5 mg via TRANSDERMAL
  Filled 2014-11-24: qty 1

## 2014-11-24 MED ORDER — PROPOFOL 10 MG/ML IV BOLUS
INTRAVENOUS | Status: AC
  Filled 2014-11-24: qty 20

## 2014-11-24 MED ORDER — LACTATED RINGERS IV SOLN
INTRAVENOUS | Status: DC
  Administered 2014-11-24 (×2): via INTRAVENOUS

## 2014-11-24 MED ORDER — CEFAZOLIN SODIUM-DEXTROSE 2-3 GM-% IV SOLR
2.0000 g | INTRAVENOUS | Status: AC
  Administered 2014-11-24: 2 g via INTRAVENOUS

## 2014-11-24 MED ORDER — FENTANYL CITRATE (PF) 100 MCG/2ML IJ SOLN
INTRAMUSCULAR | Status: DC | PRN
  Administered 2014-11-24 (×2): 50 ug via INTRAVENOUS
  Administered 2014-11-24: 100 ug via INTRAVENOUS

## 2014-11-24 MED ORDER — HYDROCODONE-ACETAMINOPHEN 7.5-325 MG PO TABS
1.0000 | ORAL_TABLET | Freq: Once | ORAL | Status: AC | PRN
  Administered 2014-11-24: 1 via ORAL

## 2014-11-24 MED ORDER — ACETAMINOPHEN 160 MG/5ML PO SOLN
ORAL | Status: AC
  Filled 2014-11-24: qty 20.3

## 2014-11-24 MED ORDER — HYDROCODONE-ACETAMINOPHEN 7.5-325 MG PO TABS
ORAL_TABLET | ORAL | Status: AC
  Filled 2014-11-24: qty 1

## 2014-11-24 MED ORDER — MIDAZOLAM HCL 2 MG/2ML IJ SOLN
INTRAMUSCULAR | Status: AC
  Filled 2014-11-24: qty 2

## 2014-11-24 MED ORDER — LIDOCAINE HCL (CARDIAC) 20 MG/ML IV SOLN
INTRAVENOUS | Status: DC | PRN
  Administered 2014-11-24: 100 mg via INTRAVENOUS

## 2014-11-24 MED ORDER — FENTANYL CITRATE (PF) 100 MCG/2ML IJ SOLN
25.0000 ug | INTRAMUSCULAR | Status: DC | PRN
  Administered 2014-11-24 (×3): 50 ug via INTRAVENOUS

## 2014-11-24 MED ORDER — FENTANYL CITRATE (PF) 100 MCG/2ML IJ SOLN
INTRAMUSCULAR | Status: AC
  Administered 2014-11-24: 50 ug via INTRAVENOUS
  Filled 2014-11-24: qty 2

## 2014-11-24 MED ORDER — CHLOROPROCAINE HCL 1 % IJ SOLN
INTRAMUSCULAR | Status: AC
  Filled 2014-11-24: qty 30

## 2014-11-24 MED ORDER — CHLOROPROCAINE HCL 1 % IJ SOLN
INTRAMUSCULAR | Status: DC | PRN
  Administered 2014-11-24: 20 mL

## 2014-11-24 MED ORDER — MEPERIDINE HCL 25 MG/ML IJ SOLN: 6.2500 mg | INTRAMUSCULAR | Status: DC | PRN

## 2014-11-24 MED ORDER — ONDANSETRON HCL 4 MG/2ML IJ SOLN
INTRAMUSCULAR | Status: AC
  Filled 2014-11-24: qty 2

## 2014-11-24 MED ORDER — ONDANSETRON HCL 4 MG/2ML IJ SOLN
INTRAMUSCULAR | Status: DC | PRN
  Administered 2014-11-24: 4 mg via INTRAVENOUS

## 2014-11-24 SURGICAL SUPPLY — 13 items
ABLATOR ENDOMETRIAL BIPOLAR (ABLATOR) ×3 IMPLANT
CATH ROBINSON RED A/P 16FR (CATHETERS) ×3 IMPLANT
CLOTH BEACON ORANGE TIMEOUT ST (SAFETY) ×3 IMPLANT
CONTAINER PREFILL 10% NBF 60ML (FORM) ×2 IMPLANT
GLOVE BIO SURGEON STRL SZ 6.5 (GLOVE) ×2 IMPLANT
GLOVE BIO SURGEONS STRL SZ 6.5 (GLOVE) ×1
GLOVE BIOGEL PI IND STRL 7.0 (GLOVE) ×3 IMPLANT
GLOVE BIOGEL PI INDICATOR 7.0 (GLOVE) ×6
GOWN STRL REUS W/TWL LRG LVL3 (GOWN DISPOSABLE) ×6 IMPLANT
PACK VAGINAL MINOR WOMEN LF (CUSTOM PROCEDURE TRAY) ×3 IMPLANT
PAD OB MATERNITY 4.3X12.25 (PERSONAL CARE ITEMS) ×3 IMPLANT
TOWEL OR 17X24 6PK STRL BLUE (TOWEL DISPOSABLE) ×6 IMPLANT
WATER STERILE IRR 1000ML POUR (IV SOLUTION) ×3 IMPLANT

## 2014-11-24 NOTE — Discharge Instructions (Signed)
Hysteroscopy, Care After  Refer to this sheet in the next few weeks. These instructions provide you with information on caring for yourself after your procedure. Your health care provider may also give you more specific instructions. Your treatment has been planned according to current medical practices, but problems sometimes occur. Call your health care provider if you have any problems or questions after your procedure.   WHAT TO EXPECT AFTER THE PROCEDURE  After your procedure, it is typical to have the following:  · You may have some cramping. This normally lasts for a couple days.  · You may have bleeding. This can vary from light spotting for a few days to menstrual-like bleeding for 3-7 days.  HOME CARE INSTRUCTIONS  · Rest for the first 1-2 days after the procedure.  · Only take over-the-counter or prescription medicines as directed by your health care provider. Do not take aspirin. It can increase the chances of bleeding.  · Take showers instead of baths for 2 weeks or as directed by your health care provider.  · Do not drive for 24 hours or as directed.  · Do not drink alcohol while taking pain medicine.  · Do not use tampons, douche, or have sexual intercourse for 2 weeks or until your health care provider says it is okay.  · Take your temperature twice a day for 4-5 days. Write it down each time.  · Follow your health care provider's advice about diet, exercise, and lifting.  · If you develop constipation, you may:    Take a mild laxative if your health care provider approves.    Add bran foods to your diet.    Drink enough fluids to keep your urine clear or pale yellow.  · Try to have someone with you or available to you for the first 24-48 hours, especially if you were given a general anesthetic.  · Follow up with your health care provider as directed.  SEEK MEDICAL CARE IF:  · You feel dizzy or lightheaded.  · You feel sick to your stomach (nauseous).  · You have abnormal vaginal discharge.  · You  have a rash.  · You have pain that is not controlled with medicine.  SEEK IMMEDIATE MEDICAL CARE IF:  · You have bleeding that is heavier than a normal menstrual period.  · You have a fever.  · You have increasing cramps or pain, not controlled with medicine.  · You have new belly (abdominal) pain.  · You pass out.  · You have pain in the tops of your shoulders (shoulder strap areas).  · You have shortness of breath.     This information is not intended to replace advice given to you by your health care provider. Make sure you discuss any questions you have with your health care provider.     Document Released: 10/08/2012 Document Reviewed: 10/08/2012  Elsevier Interactive Patient Education ©2016 Elsevier Inc.

## 2014-11-24 NOTE — Op Note (Signed)
11/24/2014  11:23 AM  PATIENT:  Candace FragminMelissa A Lucena  44 y.o. female  PRE-OPERATIVE DIAGNOSIS:  Menorrhagia  POST-OPERATIVE DIAGNOSIS:  Menorrhagia  PROCEDURE:  Procedure(s): HYSTEROSCOPY, DILATATION & CURETTAGE  WITH FAILED  NOVASURE ABLATION  SURGEON:  Surgeon(s): Genia DelMarie-Lyne Angelissa Supan, MD  ASSISTANTS: none   ANESTHESIA:   general   PROCEDURE:  Under general anesthesia with laryngeal mask the patient is an lithotomy position. She is prepped with Betadine and the suprapubic, vulvar and vaginal areas.  She is draped as usual.  The vaginal exam reveals an anteverted uterus and no adnexal masses.  The speculum is inserted in the vagina and the anterior lip of the cervix was grasped with a tenaculum.  A paracervical block is done with Nesacaine 1% a total of 20 cc at 4 and 8:00.  The cervical length is 4 cm and the instrument tree is at 8 cm for a cavity length of 4 cm.  Dilation of the cervix with dilators up to #29 without difficulty.  The endometrial lining was thin at 5 mm by pelvic ultrasound.  We proceed with a systematic curettage of the intrauterine cavity on all surfaces using a sharp curet.  The specimen is sent to pathology.  We then inserted the NovaSure device in the intrauterine cavity.  The width of the cavity is measured at 4.7 cm.  We do the NovaSure security test which is not passed.  Given that this is suspicious for perforation the decision is made to proceed with a diagnostic hysteroscopy.  The hysteroscopy confirms fundal perforation with an otherwise normal intrauterine cavity, no hemorrhage and no sign of other complication.  The perforation probably occurred at the time of measurement of the intrauterine width.  All instruments are removed.  The patient is transferred to PACU in good and stable condition.  She will be observed in the PACU and a repeat CBC will be done.  If hemodynamically stable, she will be discharged home and follow-up will be organized next week in the  office.  ESTIMATED BLOOD LOSS: 15 cc   Intake/Output Summary (Last 24 hours) at 11/24/14 1123 Last data filed at 11/24/14 1115  Gross per 24 hour  Intake   1600 ml  Output     65 ml  Net   1535 ml     BLOOD ADMINISTERED:none   LOCAL MEDICATIONS USED:  Nesacaine 1% 20 cc Paracervical Block  SPECIMEN:  Source of Specimen:  Endometrial Curettings  DISPOSITION OF SPECIMEN:  PATHOLOGY  COUNTS:  YES  PLAN OF CARE: Transfer to PACU  Genia DelMarie-Lyne Jalesha Plotz MD  11/24/2014 at 11:24 am

## 2014-11-24 NOTE — Anesthesia Postprocedure Evaluation (Deleted)
Anesthesia Post Note  Patient: Candace Espinoza  Procedure(s) Performed: Procedure(s) (LRB): Hysteroscopy DILATATION & CURETTAGE  WITH Failed  NOVASURE ABLATION (N/A)  Patient location during evaluation: PACU Anesthesia Type: Spinal Level of consciousness: awake and alert and oriented Pain management: pain level controlled Vital Signs Assessment: post-procedure vital signs reviewed and stable Respiratory status: spontaneous breathing and nonlabored ventilation Cardiovascular status: blood pressure returned to baseline and stable Postop Assessment: No headache, No backache, Spinal receding and Patient able to bend at knees Anesthetic complications: no    Last Vitals:  Filed Vitals:   11/24/14 1130  BP: 112/67  Pulse: 84  Temp: 36.6 C  Resp: 14    Last Pain:  Filed Vitals:   11/24/14 1141  PainSc: 6                  Faust Thorington A.

## 2014-11-24 NOTE — OR Nursing (Signed)
Novasure instrument failed twice so Dr. Seymour BarsLavoie did a hysteroscopy

## 2014-11-24 NOTE — Anesthesia Preprocedure Evaluation (Signed)
Anesthesia Evaluation  Patient identified by MRN, date of birth, ID band Patient awake    Reviewed: Allergy & Precautions, NPO status , Patient's Chart, lab work & pertinent test results  Airway Mallampati: III  TM Distance: >3 FB Neck ROM: Full    Dental no notable dental hx. (+) Teeth Intact   Pulmonary Current Smoker,    Pulmonary exam normal breath sounds clear to auscultation       Cardiovascular negative cardio ROS Normal cardiovascular exam Rhythm:Regular Rate:Normal     Neuro/Psych  Headaches, Anxiety    GI/Hepatic Neg liver ROS, GERD  Medicated and Controlled,  Endo/Other  negative endocrine ROS  Renal/GU negative Renal ROS  negative genitourinary   Musculoskeletal negative musculoskeletal ROS (+)   Abdominal   Peds  Hematology  (+) anemia ,   Anesthesia Other Findings   Reproductive/Obstetrics Menorrhagia                              Anesthesia Physical Anesthesia Plan  ASA: II  Anesthesia Plan: General   Post-op Pain Management:    Induction: Intravenous  Airway Management Planned: LMA  Additional Equipment:   Intra-op Plan:   Post-operative Plan: Extubation in OR  Informed Consent: I have reviewed the patients History and Physical, chart, labs and discussed the procedure including the risks, benefits and alternatives for the proposed anesthesia with the patient or authorized representative who has indicated his/her understanding and acceptance.   Dental advisory given  Plan Discussed with: CRNA, Anesthesiologist and Surgeon  Anesthesia Plan Comments:         Anesthesia Quick Evaluation

## 2014-11-24 NOTE — H&P (Signed)
Candace FragminMelissa A Espinoza is an 44 y.o. female  G0  RP:  D+C, Novasure Endometrial Ablation for Menorrhagia.   Pertinent Gynecological History: Menses: flow is excessive with use of many pads or tampons on heaviest days Contraception: Depo-Provera injections Blood transfusions: none Sexually transmitted diseases: no past history Last mammogram: normal  Last pap: normal    Menstrual History:  No LMP recorded. Patient has had an injection.    Past Medical History  Diagnosis Date  . Anxiety   . Anemia     Vitamin B-12 Deficiency  . GERD (gastroesophageal reflux disease)   . Esophagitis     Past Surgical History  Procedure Laterality Date  . Wrist ganglion excision      Right  . Upper gi endoscopy      Family History  Problem Relation Age of Onset  . Diabetes      1st degree relative  . Hypertension      Social History:  reports that she has been smoking Cigarettes.  She has a 2 pack-year smoking history. She has never used smokeless tobacco. She reports that she drinks alcohol. She reports that she does not use illicit drugs.  Allergies:  Allergies  Allergen Reactions  . Codeine Itching  . Doxycycline Other (See Comments)    Caused ulcers    Prescriptions prior to admission  Medication Sig Dispense Refill Last Dose  . Calcium-Magnesium-Vitamin D (CALCIUM MAGNESIUM PO) Take 2 tablets by mouth daily.     . Cholecalciferol (VITAMIN D PO) Take 1 tablet by mouth daily.     . Probiotic Product (PROBIOTIC PO) Take 1 capsule by mouth daily.      Marland Kitchen. ALPRAZolam (XANAX) 0.25 MG tablet Take 1 tablet (0.25 mg total) by mouth 3 (three) times daily as needed for anxiety. (Patient not taking: Reported on 11/22/2014) 30 tablet 2   . Ciclopirox 1 % shampoo Massage into scalp and let sit 3 min then rinse--- use 2x a week x 4 weeks 120 mL 1 Taking  . cyclobenzaprine (FLEXERIL) 10 MG tablet Take 1 tablet (10 mg total) by mouth 3 (three) times daily as needed for muscle spasms. (Patient not  taking: Reported on 11/22/2014) 30 tablet 0   . HYDROcodone-acetaminophen (NORCO) 5-325 MG per tablet Take 1 tablet by mouth every 6 (six) hours as needed for moderate pain. (Patient not taking: Reported on 11/22/2014) 30 tablet 0 Taking  . meclizine (ANTIVERT) 25 MG tablet Take 1 tablet (25 mg total) by mouth 3 (three) times daily as needed for dizziness. (Patient not taking: Reported on 11/22/2014) 30 tablet 0   . medroxyPROGESTERone (DEPO-PROVERA) 150 MG/ML injection Inject 150 mLs into the muscle every 3 (three) months.   Taking    ROS Neg  There were no vitals taken for this visit. Physical Exam   Pelvic US:  Thin Endometrial line at 5+ mm.  Results for orders placed or performed during the hospital encounter of 11/24/14 (from the past 24 hour(s))  Pregnancy, urine     Status: None   Collection Time: 11/24/14  9:15 AM  Result Value Ref Range   Preg Test, Ur NEGATIVE NEGATIVE  CBC     Status: None   Collection Time: 11/24/14  9:25 AM  Result Value Ref Range   WBC 6.7 4.0 - 10.5 K/uL   RBC 4.19 3.87 - 5.11 MIL/uL   Hemoglobin 13.1 12.0 - 15.0 g/dL   HCT 40.939.3 81.136.0 - 91.446.0 %   MCV 93.8 78.0 - 100.0 fL  MCH 31.3 26.0 - 34.0 pg   MCHC 33.3 30.0 - 36.0 g/dL   RDW 09.8 11.9 - 14.7 %   Platelets 340 150 - 400 K/uL      Assessment/Plan: Menorrhagia for D+C Novasure Endometrial Ablation.  Surgery and risks reviewed.  Nathanyal Ashmead,MARIE-LYNE 11/24/2014, 10:09 AM

## 2014-11-24 NOTE — Anesthesia Procedure Notes (Signed)
Procedure Name: LMA Insertion Date/Time: 11/24/2014 10:39 AM Performed by: Junious SilkGILBERT, Nell Schrack Pre-anesthesia Checklist: Patient identified, Emergency Drugs available, Suction available, Patient being monitored and Timeout performed Patient Re-evaluated:Patient Re-evaluated prior to inductionOxygen Delivery Method: Circle system utilized Preoxygenation: Pre-oxygenation with 100% oxygen Intubation Type: IV induction Ventilation: Mask ventilation without difficulty LMA: LMA inserted LMA Size: 4.0 Number of attempts: 1 Placement Confirmation: positive ETCO2,  CO2 detector and breath sounds checked- equal and bilateral Tube secured with: Tape Dental Injury: Teeth and Oropharynx as per pre-operative assessment

## 2014-11-24 NOTE — Transfer of Care (Signed)
Immediate Anesthesia Transfer of Care Note  Patient: Candace FragminMelissa A Wingerter  Procedure(s) Performed: Procedure(s): Hysteroscopy DILATATION & CURETTAGE  WITH Failed  NOVASURE ABLATION (N/A)  Patient Location: PACU  Anesthesia Type:General  Level of Consciousness: awake, alert  and oriented  Airway & Oxygen Therapy: Patient Spontanous Breathing and Patient connected to nasal cannula oxygen  Post-op Assessment: Report given to RN and Post -op Vital signs reviewed and stable  Post vital signs: Reviewed and stable  Last Vitals: There were no vitals filed for this visit.  Complications: No apparent anesthesia complications

## 2014-11-24 NOTE — Anesthesia Postprocedure Evaluation (Signed)
Anesthesia Post Note  Patient: Candace Espinoza  Procedure(s) Performed: Procedure(s) (LRB): Hysteroscopy DILATATION & CURETTAGE  WITH Failed  NOVASURE ABLATION (N/A)  Patient location during evaluation: PACU Anesthesia Type: General Level of consciousness: awake and alert and oriented Pain management: pain level controlled Vital Signs Assessment: post-procedure vital signs reviewed and stable Respiratory status: spontaneous breathing, nonlabored ventilation and respiratory function stable Cardiovascular status: blood pressure returned to baseline and stable Postop Assessment: No signs of nausea or vomiting Anesthetic complications: no    Last Vitals:  Filed Vitals:   11/24/14 1300 11/24/14 1449  BP: 122/79 111/77  Pulse: 76 64  Temp: 36.6 C 36.8 C  Resp: 16 16    Last Pain:  Filed Vitals:   11/24/14 1451  PainSc: 4                  Danyetta Gillham A.

## 2014-11-24 NOTE — Discharge Summary (Signed)
  Physician Discharge Summary  Patient ID: Candace FragminMelissa A Upshaw MRN: [id number] DOB/AGE: 44/01/1970 44 y.o.  Admit date: 11/24/2014 Discharge date: 11/24/2014  Admission Diagnoses: Menorrhagia  Discharge Diagnoses: Menorrhagia        Active Problems:   * No active hospital problems. *   Discharged Condition: good  Hospital Course: Outpatient  Consults: None  Treatments: surgery: D+C, Dx Hysteroscopy, Failed Novasure Endometrial Ablation  Complication:  Uterine Perforation  Disposition: D/C home     Medication List    ASK your doctor about these medications        ALPRAZolam 0.25 MG tablet  Commonly known as:  XANAX  Take 1 tablet (0.25 mg total) by mouth 3 (three) times daily as needed for anxiety.     CALCIUM MAGNESIUM PO  Take 2 tablets by mouth daily.     Ciclopirox 1 % shampoo  Massage into scalp and let sit 3 min then rinse--- use 2x a week x 4 weeks     cyclobenzaprine 10 MG tablet  Commonly known as:  FLEXERIL  Take 1 tablet (10 mg total) by mouth 3 (three) times daily as needed for muscle spasms.     HYDROcodone-acetaminophen 5-325 MG tablet  Commonly known as:  NORCO  Take 1 tablet by mouth every 6 (six) hours as needed for moderate pain.     meclizine 25 MG tablet  Commonly known as:  ANTIVERT  Take 1 tablet (25 mg total) by mouth 3 (three) times daily as needed for dizziness.     medroxyPROGESTERone 150 MG/ML injection  Commonly known as:  DEPO-PROVERA  Inject 150 mLs into the muscle every 3 (three) months.     PROBIOTIC PO  Take 1 capsule by mouth daily.     VITAMIN D PO  Take 1 tablet by mouth daily.           Follow-up Information    Follow up with Jayni Prescher,MARIE-LYNE, MD In 1 week.   Specialty:  Obstetrics and Gynecology   Contact information:   [address] [place] [id number] [phone]       Signed: Genia DelLAVOIE,MARIE-LYNE, MD 11/24/2014, 11:40 AM

## 2014-11-29 ENCOUNTER — Encounter (HOSPITAL_COMMUNITY): Payer: Self-pay | Admitting: Obstetrics & Gynecology

## 2022-08-23 ENCOUNTER — Other Ambulatory Visit: Payer: Self-pay | Admitting: Obstetrics and Gynecology

## 2022-08-23 DIAGNOSIS — R928 Other abnormal and inconclusive findings on diagnostic imaging of breast: Secondary | ICD-10-CM

## 2022-09-05 ENCOUNTER — Other Ambulatory Visit: Payer: Managed Care, Other (non HMO)

## 2022-09-12 ENCOUNTER — Ambulatory Visit
Admission: RE | Admit: 2022-09-12 | Discharge: 2022-09-12 | Disposition: A | Discharge status: Home / Self Care | Payer: Managed Care, Other (non HMO) | Source: Ambulatory Visit | Attending: Obstetrics and Gynecology | Admitting: Obstetrics and Gynecology

## 2022-09-12 ENCOUNTER — Other Ambulatory Visit: Payer: Self-pay | Admitting: Obstetrics and Gynecology

## 2022-09-12 ENCOUNTER — Ambulatory Visit
Admission: RE | Admit: 2022-09-12 | Discharge: 2022-09-12 | Disposition: A | Discharge status: Home / Self Care | Payer: 59 | Source: Ambulatory Visit | Attending: Obstetrics and Gynecology

## 2022-09-12 DIAGNOSIS — N6310 Unspecified lump in the right breast, unspecified quadrant: Secondary | ICD-10-CM

## 2022-09-12 DIAGNOSIS — R928 Other abnormal and inconclusive findings on diagnostic imaging of breast: Secondary | ICD-10-CM

## 2022-09-17 ENCOUNTER — Ambulatory Visit
Admission: RE | Admit: 2022-09-17 | Discharge: 2022-09-17 | Disposition: A | Discharge status: Home / Self Care | Payer: 59 | Source: Ambulatory Visit | Attending: Obstetrics and Gynecology | Admitting: Obstetrics and Gynecology

## 2022-09-17 ENCOUNTER — Ambulatory Visit
Admission: RE | Admit: 2022-09-17 | Discharge: 2022-09-17 | Disposition: A | Discharge status: Home / Self Care | Payer: 59 | Source: Ambulatory Visit | Attending: Obstetrics and Gynecology

## 2022-09-17 DIAGNOSIS — N6310 Unspecified lump in the right breast, unspecified quadrant: Secondary | ICD-10-CM

## 2022-09-17 HISTORY — PX: BREAST BIOPSY: SHX20

## 2022-09-18 LAB — SURGICAL PATHOLOGY

## 2022-09-21 ENCOUNTER — Ambulatory Visit: Payer: Self-pay | Admitting: Surgery

## 2022-09-21 DIAGNOSIS — C50911 Malignant neoplasm of unspecified site of right female breast: Secondary | ICD-10-CM

## 2022-09-26 ENCOUNTER — Other Ambulatory Visit: Payer: Self-pay | Admitting: Surgery

## 2022-09-26 DIAGNOSIS — C50911 Malignant neoplasm of unspecified site of right female breast: Secondary | ICD-10-CM

## 2022-10-02 ENCOUNTER — Encounter: Payer: Self-pay | Admitting: Genetic Counselor

## 2022-10-03 ENCOUNTER — Encounter: Payer: Self-pay | Admitting: Genetic Counselor

## 2022-10-03 ENCOUNTER — Encounter: Payer: Self-pay | Admitting: *Deleted

## 2022-10-03 DIAGNOSIS — Z170 Estrogen receptor positive status [ER+]: Secondary | ICD-10-CM

## 2022-10-04 ENCOUNTER — Telehealth: Payer: Self-pay | Admitting: Genetic Counselor

## 2022-10-04 NOTE — Telephone Encounter (Signed)
I contacted Ms. Candace Espinoza to schedule her genetic counseling appointment STAT due to surgery scheduled on 10/18/2022. Ms. Candace Espinoza was quite confused about the referral. After explaining why we recommend genetics, she said she would like to speak with her husband before getting scheduled. I let her know that in order to have genetics back before surgery, she will need to let us know as soon as possible.   Lalla Brothers, MS, [facility] Genetic Counselor [email or web address] (P) [phone]

## 2022-10-05 ENCOUNTER — Telehealth: Payer: Self-pay | Admitting: Genetic Counselor

## 2022-10-05 NOTE — Telephone Encounter (Signed)
Per IB message on 10/03/22; I called patient to schedule Genetic counseling. Patient said she had already spoke to someone and told them she wanted to deny the appointment.

## 2022-10-11 ENCOUNTER — Other Ambulatory Visit: Payer: Self-pay

## 2022-10-11 ENCOUNTER — Encounter (HOSPITAL_BASED_OUTPATIENT_CLINIC_OR_DEPARTMENT_OTHER): Payer: Self-pay | Admitting: Surgery

## 2022-10-17 ENCOUNTER — Ambulatory Visit
Admission: RE | Admit: 2022-10-17 | Discharge: 2022-10-17 | Disposition: A | Discharge status: Home / Self Care | Payer: 59 | Source: Ambulatory Visit | Attending: Surgery | Admitting: Surgery

## 2022-10-17 DIAGNOSIS — C50911 Malignant neoplasm of unspecified site of right female breast: Secondary | ICD-10-CM

## 2022-10-17 HISTORY — PX: BREAST BIOPSY: SHX20

## 2022-10-17 MED ORDER — CHLORHEXIDINE GLUCONATE CLOTH 2 % EX PADS: 6.0000 | MEDICATED_PAD | Freq: Once | CUTANEOUS | Status: DC

## 2022-10-17 NOTE — Progress Notes (Signed)

## 2022-10-18 ENCOUNTER — Ambulatory Visit (HOSPITAL_BASED_OUTPATIENT_CLINIC_OR_DEPARTMENT_OTHER): Payer: 59 | Admitting: Anesthesiology

## 2022-10-18 ENCOUNTER — Other Ambulatory Visit: Payer: Self-pay

## 2022-10-18 ENCOUNTER — Ambulatory Visit
Admission: RE | Admit: 2022-10-18 | Discharge: 2022-10-18 | Disposition: A | Discharge status: Home / Self Care | Payer: 59 | Source: Ambulatory Visit | Attending: Surgery | Admitting: Surgery

## 2022-10-18 ENCOUNTER — Encounter (HOSPITAL_BASED_OUTPATIENT_CLINIC_OR_DEPARTMENT_OTHER)
Admission: RE | Disposition: A | Discharge status: Home / Self Care | Payer: Self-pay | Source: Home / Self Care | Attending: Surgery

## 2022-10-18 ENCOUNTER — Encounter (HOSPITAL_BASED_OUTPATIENT_CLINIC_OR_DEPARTMENT_OTHER): Payer: Self-pay | Admitting: Surgery

## 2022-10-18 ENCOUNTER — Ambulatory Visit (HOSPITAL_BASED_OUTPATIENT_CLINIC_OR_DEPARTMENT_OTHER)
Admission: RE | Admit: 2022-10-18 | Discharge: 2022-10-18 | Disposition: A | Discharge status: Home / Self Care | Payer: 59 | Attending: Surgery | Admitting: Surgery

## 2022-10-18 DIAGNOSIS — C50911 Malignant neoplasm of unspecified site of right female breast: Secondary | ICD-10-CM

## 2022-10-18 DIAGNOSIS — F419 Anxiety disorder, unspecified: Secondary | ICD-10-CM | POA: Diagnosis not present

## 2022-10-18 DIAGNOSIS — C50411 Malignant neoplasm of upper-outer quadrant of right female breast: Secondary | ICD-10-CM | POA: Diagnosis present

## 2022-10-18 DIAGNOSIS — Z170 Estrogen receptor positive status [ER+]: Secondary | ICD-10-CM | POA: Diagnosis not present

## 2022-10-18 DIAGNOSIS — Z1721 Progesterone receptor positive status: Secondary | ICD-10-CM | POA: Diagnosis not present

## 2022-10-18 DIAGNOSIS — Z01818 Encounter for other preprocedural examination: Secondary | ICD-10-CM

## 2022-10-18 HISTORY — PX: BREAST LUMPECTOMY WITH RADIOACTIVE SEED AND SENTINEL LYMPH NODE BIOPSY: SHX6550

## 2022-10-18 LAB — POCT PREGNANCY, URINE: Preg Test, Ur: NEGATIVE

## 2022-10-18 SURGERY — BREAST LUMPECTOMY WITH RADIOACTIVE SEED AND SENTINEL LYMPH NODE BIOPSY
Anesthesia: General | Site: Breast | Laterality: Right

## 2022-10-18 MED ORDER — FENTANYL CITRATE (PF) 100 MCG/2ML IJ SOLN
INTRAMUSCULAR | Status: DC | PRN
  Administered 2022-10-18: 100 ug via INTRAVENOUS

## 2022-10-18 MED ORDER — CEFAZOLIN SODIUM-DEXTROSE 2-4 GM/100ML-% IV SOLN
2.0000 g | INTRAVENOUS | Status: AC
  Administered 2022-10-18: 2 g via INTRAVENOUS

## 2022-10-18 MED ORDER — BUPIVACAINE-EPINEPHRINE (PF) 0.25% -1:200000 IJ SOLN
INTRAMUSCULAR | Status: DC | PRN
  Administered 2022-10-18: 14 mL

## 2022-10-18 MED ORDER — ROPIVACAINE HCL 5 MG/ML IJ SOLN
INTRAMUSCULAR | Status: DC | PRN
Start: 2022-10-18 — End: 2022-10-18
  Administered 2022-10-18: 30 mL via EPIDURAL

## 2022-10-18 MED ORDER — OXYCODONE HCL 5 MG PO TABS
5.0000 mg | ORAL_TABLET | Freq: Once | ORAL | Status: AC | PRN
  Administered 2022-10-18: 5 mg via ORAL

## 2022-10-18 MED ORDER — ACETAMINOPHEN 10 MG/ML IV SOLN: 1000.0000 mg | Freq: Once | INTRAVENOUS | Status: DC | PRN

## 2022-10-18 MED ORDER — CEFAZOLIN SODIUM-DEXTROSE 2-4 GM/100ML-% IV SOLN
INTRAVENOUS | Status: AC
  Filled 2022-10-18: qty 100

## 2022-10-18 MED ORDER — LACTATED RINGERS IV SOLN: INTRAVENOUS | Status: DC

## 2022-10-18 MED ORDER — CLONIDINE HCL (ANALGESIA) 100 MCG/ML EP SOLN
EPIDURAL | Status: DC | PRN
Start: 2022-10-18 — End: 2022-10-18
  Administered 2022-10-18: 100 ug

## 2022-10-18 MED ORDER — MIDAZOLAM HCL 2 MG/2ML IJ SOLN
INTRAMUSCULAR | Status: AC
  Filled 2022-10-18: qty 2

## 2022-10-18 MED ORDER — DEXAMETHASONE SODIUM PHOSPHATE 4 MG/ML IJ SOLN
INTRAMUSCULAR | Status: DC | PRN
  Administered 2022-10-18: 5 mg via INTRAVENOUS

## 2022-10-18 MED ORDER — PROPOFOL 10 MG/ML IV BOLUS
INTRAVENOUS | Status: AC
  Filled 2022-10-18: qty 20

## 2022-10-18 MED ORDER — DEXAMETHASONE SODIUM PHOSPHATE 10 MG/ML IJ SOLN
INTRAMUSCULAR | Status: DC | PRN
Start: 2022-10-18 — End: 2022-10-18
  Administered 2022-10-18: 10 mg

## 2022-10-18 MED ORDER — BUPIVACAINE-EPINEPHRINE (PF) 0.25% -1:200000 IJ SOLN
INTRAMUSCULAR | Status: AC
  Filled 2022-10-18: qty 90

## 2022-10-18 MED ORDER — ACETAMINOPHEN 500 MG PO TABS
1000.0000 mg | ORAL_TABLET | ORAL | Status: AC
  Administered 2022-10-18: 1000 mg via ORAL

## 2022-10-18 MED ORDER — 0.9 % SODIUM CHLORIDE (POUR BTL) OPTIME
TOPICAL | Status: DC | PRN
Start: 2022-10-18 — End: 2022-10-18
  Administered 2022-10-18: 300 mL

## 2022-10-18 MED ORDER — MAGTRACE LYMPHATIC TRACER
INTRAMUSCULAR | Status: DC | PRN
  Administered 2022-10-18: 2 mL via INTRAMUSCULAR

## 2022-10-18 MED ORDER — FENTANYL CITRATE (PF) 100 MCG/2ML IJ SOLN: 25.0000 ug | INTRAMUSCULAR | Status: DC | PRN

## 2022-10-18 MED ORDER — MIDAZOLAM HCL 2 MG/2ML IJ SOLN
2.0000 mg | Freq: Once | INTRAMUSCULAR | Status: AC
  Administered 2022-10-18: 2 mg via INTRAVENOUS

## 2022-10-18 MED ORDER — ACETAMINOPHEN 500 MG PO TABS
ORAL_TABLET | ORAL | Status: AC
  Filled 2022-10-18: qty 2

## 2022-10-18 MED ORDER — ONDANSETRON HCL 4 MG/2ML IJ SOLN
INTRAMUSCULAR | Status: AC
  Filled 2022-10-18: qty 2

## 2022-10-18 MED ORDER — LIDOCAINE 2% (20 MG/ML) 5 ML SYRINGE
INTRAMUSCULAR | Status: AC
  Filled 2022-10-18: qty 5

## 2022-10-18 MED ORDER — MIDAZOLAM HCL 5 MG/5ML IJ SOLN
INTRAMUSCULAR | Status: DC | PRN
  Administered 2022-10-18 (×2): 1 mg via INTRAVENOUS

## 2022-10-18 MED ORDER — OXYCODONE HCL 5 MG PO TABS
ORAL_TABLET | ORAL | Status: AC
  Filled 2022-10-18: qty 1

## 2022-10-18 MED ORDER — LIDOCAINE HCL (CARDIAC) PF 100 MG/5ML IV SOSY
PREFILLED_SYRINGE | INTRAVENOUS | Status: DC | PRN
  Administered 2022-10-18: 20 mg via INTRAVENOUS

## 2022-10-18 MED ORDER — OXYCODONE HCL 5 MG PO TABS: 5.0000 mg | ORAL_TABLET | Freq: Four times a day (QID) | ORAL | 0 refills | Status: DC | PRN

## 2022-10-18 MED ORDER — PROPOFOL 10 MG/ML IV BOLUS
INTRAVENOUS | Status: DC | PRN
  Administered 2022-10-18: 100 mg via INTRAVENOUS

## 2022-10-18 MED ORDER — DEXAMETHASONE SODIUM PHOSPHATE 10 MG/ML IJ SOLN
INTRAMUSCULAR | Status: AC
  Filled 2022-10-18: qty 1

## 2022-10-18 MED ORDER — FENTANYL CITRATE (PF) 100 MCG/2ML IJ SOLN
INTRAMUSCULAR | Status: AC
  Filled 2022-10-18: qty 2

## 2022-10-18 MED ORDER — OXYCODONE HCL 5 MG/5ML PO SOLN: 5.0000 mg | Freq: Once | ORAL | Status: AC | PRN

## 2022-10-18 MED ORDER — ONDANSETRON HCL 4 MG/2ML IJ SOLN
INTRAMUSCULAR | Status: DC | PRN
  Administered 2022-10-18: 4 mg via INTRAVENOUS

## 2022-10-18 MED ORDER — ONDANSETRON HCL 4 MG/2ML IJ SOLN: 4.0000 mg | Freq: Once | INTRAMUSCULAR | Status: DC | PRN

## 2022-10-18 SURGICAL SUPPLY — 54 items
ADH SKN CLS APL DERMABOND .7 (GAUZE/BANDAGES/DRESSINGS) ×1
APL PRP STRL LF DISP 70% ISPRP (MISCELLANEOUS) ×1
APPLIER CLIP 9.375 MED OPEN (MISCELLANEOUS) ×1
APR CLP MED 9.3 20 MLT OPN (MISCELLANEOUS) ×1
BAG DECANTER FOR FLEXI CONT (MISCELLANEOUS) IMPLANT
BINDER BREAST LRG (GAUZE/BANDAGES/DRESSINGS) IMPLANT
BINDER BREAST MEDIUM (GAUZE/BANDAGES/DRESSINGS) IMPLANT
BINDER BREAST XLRG (GAUZE/BANDAGES/DRESSINGS) IMPLANT
BINDER BREAST XXLRG (GAUZE/BANDAGES/DRESSINGS) IMPLANT
BLADE SURG 15 STRL LF DISP TIS (BLADE) ×2 IMPLANT
BLADE SURG 15 STRL SS (BLADE) ×1
CANISTER SUC SOCK COL 7IN (MISCELLANEOUS) IMPLANT
CANISTER SUCT 1200ML W/VALVE (MISCELLANEOUS) ×2 IMPLANT
CHLORAPREP W/TINT 26 (MISCELLANEOUS) ×2 IMPLANT
CLIP APPLIE 9.375 MED OPEN (MISCELLANEOUS) ×2 IMPLANT
COVER BACK TABLE 60X90IN (DRAPES) ×2 IMPLANT
COVER MAYO STAND STRL (DRAPES) ×2 IMPLANT
COVER PROBE CYLINDRICAL 5X96 (MISCELLANEOUS) ×2 IMPLANT
DERMABOND ADVANCED .7 DNX12 (GAUZE/BANDAGES/DRESSINGS) ×2 IMPLANT
DRAPE LAPAROSCOPIC ABDOMINAL (DRAPES) ×2 IMPLANT
DRAPE UTILITY XL STRL (DRAPES) ×2 IMPLANT
ELECT COATED BLADE 2.86 ST (ELECTRODE) ×2 IMPLANT
ELECT REM PT RETURN 9FT ADLT (ELECTROSURGICAL) ×1
ELECTRODE REM PT RTRN 9FT ADLT (ELECTROSURGICAL) ×2 IMPLANT
GLOVE BIOGEL PI IND STRL 7.0 (GLOVE) IMPLANT
GLOVE BIOGEL PI IND STRL 7.5 (GLOVE) IMPLANT
GLOVE BIOGEL PI IND STRL 8 (GLOVE) ×2 IMPLANT
GLOVE ECLIPSE 8.0 STRL XLNG CF (GLOVE) ×2 IMPLANT
GLOVE SURG SS PI 7.5 STRL IVOR (GLOVE) IMPLANT
GOWN STRL REUS W/ TWL LRG LVL3 (GOWN DISPOSABLE) ×4 IMPLANT
GOWN STRL REUS W/ TWL XL LVL3 (GOWN DISPOSABLE) ×2 IMPLANT
GOWN STRL REUS W/TWL 2XL LVL3 (GOWN DISPOSABLE) IMPLANT
GOWN STRL REUS W/TWL LRG LVL3 (GOWN DISPOSABLE)
GOWN STRL REUS W/TWL XL LVL3 (GOWN DISPOSABLE) ×2
HEMOSTAT ARISTA ABSORB 3G PWDR (HEMOSTASIS) IMPLANT
HEMOSTAT SNOW SURGICEL 2X4 (HEMOSTASIS) IMPLANT
KIT MARKER MARGIN INK (KITS) ×2 IMPLANT
NDL HYPO 25X1 1.5 SAFETY (NEEDLE) ×2 IMPLANT
NDL SAFETY ECLIPSE 18X1.5 (NEEDLE) IMPLANT
NEEDLE HYPO 25X1 1.5 SAFETY (NEEDLE) ×1
NS IRRIG 1000ML POUR BTL (IV SOLUTION) ×2 IMPLANT
PACK BASIN DAY SURGERY FS (CUSTOM PROCEDURE TRAY) ×2 IMPLANT
PENCIL SMOKE EVACUATOR (MISCELLANEOUS) ×2 IMPLANT
SLEEVE SCD COMPRESS KNEE MED (STOCKING) ×2 IMPLANT
SPIKE FLUID TRANSFER (MISCELLANEOUS) IMPLANT
SPONGE T-LAP 4X18 ~~LOC~~+RFID (SPONGE) ×2 IMPLANT
SUT MNCRL AB 4-0 PS2 18 (SUTURE) ×2 IMPLANT
SUT VICRYL 3-0 CR8 SH (SUTURE) ×2 IMPLANT
SYR CONTROL 10ML LL (SYRINGE) ×2 IMPLANT
TOWEL GREEN STERILE FF (TOWEL DISPOSABLE) ×2 IMPLANT
TRACER MAGTRACE VIAL (MISCELLANEOUS) IMPLANT
TRAY FAXITRON CT DISP (TRAY / TRAY PROCEDURE) ×2 IMPLANT
TUBE CONNECTING 20X1/4 (TUBING) ×2 IMPLANT
YANKAUER SUCT BULB TIP NO VENT (SUCTIONS) ×2 IMPLANT

## 2022-10-18 NOTE — Anesthesia Procedure Notes (Signed)
Anesthesia Regional Block: Pectoralis block   Pre-Anesthetic Checklist: , timeout performed,  Correct Patient, Correct Site, Correct Laterality,  Correct Procedure, Correct Position, site marked,  Risks and benefits discussed,  Surgical consent,  Pre-op evaluation,  At surgeon's request and post-op pain management  Laterality: Right  Prep: Maximum Sterile Barrier Precautions used, chloraprep       Needles:  Injection technique: Single-shot  Needle Type: Echogenic Needle      Needle Gauge: 20     Additional Needles:   Procedures:,,,, ultrasound used (permanent image in chart),,    Narrative:  Start time: 10/18/2022 12:10 PM End time: 10/18/2022 12:15 PM Injection made incrementally with aspirations every 5 mL.  Performed by: Personally  Anesthesiologist: Mariann Barter, MD

## 2022-10-18 NOTE — Progress Notes (Signed)
Assisted Dr. Ace Gins with right, pectoralis, ultrasound guided block. Side rails up, monitors on throughout procedure. See vital signs in flow sheet. Tolerated Procedure well.

## 2022-10-18 NOTE — Anesthesia Preprocedure Evaluation (Signed)
Anesthesia Evaluation  Patient identified by MRN, date of birth, ID band Patient awake    Reviewed: Allergy & Precautions, NPO status , Patient's Chart, lab work & pertinent test results, reviewed documented beta blocker date and time   Airway Mallampati: II  TM Distance: >3 FB Neck ROM: Full    Dental no notable dental hx.    Pulmonary neg COPD, former smoker, neg PE   breath sounds clear to auscultation       Cardiovascular (-) hypertension(-) angina (-) CAD, (-) Past MI and (-) Cardiac Stents (-) dysrhythmias (-) Valvular Problems/Murmurs Rhythm:Regular Rate:Normal     Neuro/Psych  Headaches, neg Seizures  Anxiety        GI/Hepatic ,GERD  Medicated,,  Endo/Other    Renal/GU      Musculoskeletal   Abdominal   Peds  Hematology  (+) Blood dyscrasia, anemia   Anesthesia Other Findings   Reproductive/Obstetrics                              Anesthesia Physical Anesthesia Plan  ASA: 2  Anesthesia Plan: General   Post-op Pain Management: Regional block*   Induction: Intravenous  PONV Risk Score and Plan: 2 and Ondansetron and Dexamethasone  Airway Management Planned: LMA  Additional Equipment:   Intra-op Plan:   Post-operative Plan: Extubation in OR  Informed Consent: I have reviewed the patients History and Physical, chart, labs and discussed the procedure including the risks, benefits and alternatives for the proposed anesthesia with the patient or authorized representative who has indicated his/her understanding and acceptance.     Dental advisory given  Plan Discussed with: CRNA  Anesthesia Plan Comments:          Anesthesia Quick Evaluation

## 2022-10-18 NOTE — Interval H&P Note (Signed)
History and Physical Interval Note:  10/18/2022 1:07 PM  Candace Espinoza  has presented today for surgery, with the diagnosis of RIGHT BREAST CANCER.  The various methods of treatment have been discussed with the patient and family. After consideration of risks, benefits and other options for treatment, the patient has consented to  Procedure(s) with comments: RIGHT BREAST SEED LUMPECTOMY, RIGHT SENTINEL LYMPH NODE MAPPING (Right) - PEC BLOCK as a surgical intervention.  The patient's history has been reviewed, patient examined, no change in status, stable for surgery.  I have reviewed the patient's chart and labs.  Questions were answered to the patient's satisfaction.     Dashanti Burr A [place]

## 2022-10-18 NOTE — Anesthesia Procedure Notes (Signed)
Procedure Name: LMA Insertion Date/Time: 10/18/2022 1:18 PM  Performed by: Burna Cash, CRNAPre-anesthesia Checklist: Patient identified, Emergency Drugs available, Suction available and Patient being monitored Patient Re-evaluated:Patient Re-evaluated prior to induction Oxygen Delivery Method: Circle system utilized Preoxygenation: Pre-oxygenation with 100% oxygen Induction Type: IV induction Ventilation: Mask ventilation without difficulty LMA: LMA inserted LMA Size: 4.0 Number of attempts: 1 Airway Equipment and Method: Bite block Placement Confirmation: positive ETCO2 Tube secured with: Tape Dental Injury: Teeth and Oropharynx as per pre-operative assessment

## 2022-10-18 NOTE — H&P (Signed)
Chief Complaint: NEW BREAST CANCER  History of Present Illness: Candace Espinoza is a 52 y.o. female who is seen today as an office consultation for evaluation of NEW BREAST CANCER  Seen today for evaluation of newly diagnosed right breast cancer detected on screening mammography. 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 Family history of prostate cancer. No history of immediate family with breast cancer. Denies any symptoms of breast pain breast mass or nipple discharge.  Review of Systems: A complete review of systems was obtained from the patient. I have reviewed this information and discussed as appropriate with the patient. See HPI as well for other ROS.    Medical History: Past Medical History:  Diagnosis Date  Anemia  Anxiety   There is no problem list on file for this patient.  History reviewed. No pertinent surgical history.   Allergies  Allergen Reactions  Doxycycline Other (See Comments)  Caused ulcers  esophagitis   Current Outpatient Medications on File Prior to Visit  Medication Sig Dispense Refill  ALPRAZolam (XANAX) 1 MG tablet Take 1 tab by mouth in the daytime and up to 1.5 tabs at bedtime for sleep and/or anxiety.  cholecalciferol (VITAMIN D3) 400 unit tablet Take 800 Units by mouth once daily   No current facility-administered medications on file prior to visit.   Family History  Problem Relation Age of Onset  High blood pressure (Hypertension) Mother  Hyperlipidemia (Elevated cholesterol) Mother  Diabetes Mother  Colon cancer Father    Social History   Tobacco Use  Smoking Status Some Days  Types: Cigarettes  Smokeless Tobacco Never    Social History   Socioeconomic History  Marital status: Married  Tobacco Use  Smoking status: Some Days  Types: Cigarettes  Smokeless tobacco: Never   Substance and Sexual Activity  Alcohol use: Yes  Drug use: Never   Social Determinants of Health   Financial Resource Strain: Low Risk (03/24/2022)  Received from [facility]  Overall Financial Resource Strain (CARDIA)  Difficulty of Paying Living Expenses: Not hard at all  Food Insecurity: No Food Insecurity (03/24/2022)  Received from [facility]  Hunger Vital Sign  Worried About Running Out of Food in the Last Year: Never true  Ran Out of Food in the Last Year: Never true  Transportation Needs: No Transportation Needs (03/24/2022)  Received from [facility] - Transportation  Lack of Transportation (Medical): No  Lack of Transportation (Non-Medical): No  Physical Activity: Insufficiently Active (03/24/2022)  Received from [facility]  Exercise Vital Sign  Days of Exercise per Week: 2 days  Minutes of Exercise per Session: 30 min  Stress: Stress Concern Present (03/24/2022)  Received from [facility] - Occupational Stress Questionnaire  Feeling of Stress : Very much  Social Connections: Socially Integrated (03/24/2022)  Received from [facility]  Social Network  How would you rate your social network (family, work, friends)?: Good participation with social networks   Objective:   Vitals:  09/21/22 1001 09/21/22 1002  BP: 120/88  Pulse: 78  Temp: 36.6 C (97.8 F)  SpO2: 98%  Weight: 56.5 kg (124 lb 9.6 oz)  Height: 157.5 cm (5\' 2" )  PainSc: 0-No pain  PainLoc: Breast   Body mass index is 22.79 kg/m.  Physical Exam Exam conducted with a chaperone  present.  HENT:  Head: Normocephalic.  Eyes:  Pupils: Pupils are equal, round, and reactive to light.  Cardiovascular:  Rate and Rhythm: Normal rate.  Pulmonary:  Effort: Pulmonary effort is normal.  Breath sounds: No stridor.  Chest:  Breasts: Right: Normal.  Left: Normal.  Musculoskeletal:  General: Normal range of motion.  Cervical back: Normal  range of motion.  Lymphadenopathy:  Upper Body:  Right upper body: No supraclavicular or axillary adenopathy.  Left upper body: No supraclavicular or axillary adenopathy.  Skin: General: Skin is warm.  Neurological:  General: No focal deficit present.  Mental Status: She is alert.  Psychiatric:  Mood and Affect: Mood normal.  Behavior: Behavior normal.     Labs, Imaging and Diagnostic Testing:  CLINICAL DATA: Screening recall for a possible right breast mass.  EXAM: DIGITAL DIAGNOSTIC UNILATERAL RIGHT MAMMOGRAM WITH TOMOSYNTHESIS AND CAD; ULTRASOUND RIGHT BREAST LIMITED  TECHNIQUE: Right digital diagnostic mammography and breast tomosynthesis was performed. The images were evaluated with computer-aided detection. ; Targeted ultrasound examination of the right breast was performed  COMPARISON: Previous exam(s).  ACR Breast Density Category b: There are scattered areas of fibroglandular density.  FINDINGS: On spot compression imaging, the possible mass in the upper outer right breast persists as an irregular, 1 cm mass. There is an adjacent smoothly marginated that is stable mammographically, consistent with an intramammary lymph node.  Targeted right breast ultrasound is performed, showing a small irregular hypoechoic mass in the right breast at 10 o'clock, 4 cm the nipple measuring 9 x 7 x 9 mm. This is consistent in size, shape and location to the mammographic mass.  Sonographic imaging of the right axilla demonstrates normal lymph nodes. No enlarged or abnormal lymph nodes.  IMPRESSION: 1. Highly suspicious 9 mm mass in the right breast at 10 o'clock. Tissue sampling is indicated. No right axillary lymphadenopathy.  RECOMMENDATION: 1. Ultrasound-guided core needle biopsy of the 9 mm, 10 o'clock position right breast mass. This procedure was scheduled prior to the patient being discharged from the [facility].  I have discussed the findings and  recommendations with the patient. If applicable, a reminder letter will be sent to the patient regarding the next appointment.  BI-RADS CATEGORY 5: Highly suggestive of malignancy.   Electronically Signed By: Amie Portland M.D. On: 09/12/2022 10:33  1. Breast, right, needle core biopsy, 10 o'clock, 4cmfn, ribbon clip : - INVASIVE DUCTAL CARCINOMA, SEE NOTE - DUCTAL CARCINOMA IN SITU, INTERMEDIATE TO HIGH GRADE - TUBULE FORMATION: SCORE 3 - NUCLEAR PLEOMORPHISM: SCORE 3 - MITOTIC COUNT: SCORE 2 - TOTAL SCORE: 8 - OVERALL GRADE: 3 - LYMPHOVASCULAR INVASION: NOT IDENTIFIED - CANCER LENGTH: 0.7 CM - CALCIFICATIONS: PRESENT - OTHER FINDINGS: NONE  NOTE: DR. Kenard Gower REVIEWED THE CASE AND CONCURS WITH THE INTERPRETATION. A BREAST PROGNOSTIC PROFILE (ER, PR, KI-67 AND HER2) IS PENDING AND WILL BE REPORTED IN AN ADDENDUM. THE [facility] [place] WAS NOTIFIED ON 09/18/2022.  DATE SIGNED OUT: 09/18/2022 ELECTRONIC SIGNATURE Carteret Callas M.D., Nupur, Pathologist, Electronic Signature  MICROSCOPIC DESCRIPTION  CASE COMMENTS STAINS USED IN DIAGNOSIS: H&E-2 H&E-3 H&E-4 H&E *RECUT 1 SLIDE Universal Negative Control-DAB Universal Negative Control-DAB Universal Negative Control-DAB Stains used in diagnosis 1 Her2 by IHC, 1 ER-ACIS, 1 KI-67-ACIS, 1 PR-ACIS IHC scores are reported using ASCO/CAP scoring criteria. An IHC Score of 0 or 1+ is NEGATIVE for HER2, 3+ is POSITIVE for HER2, and 2+ is EQUIVOCAL. Equivocal results are reflexed to either FISH or IHC testing. Specimens are fixed in  10% Neutral Buffered Formalin for at least 6 hours and up to 72 hours. These tests have not be validated on decalcified tissue. Results should be interpreted with caution given the possibility of false negative results on decalcified specimens. Antibody Clone for HER2 is 4B5 (PATHWAY). Some of these immunohistochemical stains may have been developed and the performance characteristics determined by  [facility]. Some may not have been cleared or approved by the U.S. Food and Drug Administration. The FDA has determined that such clearance or approval is not necessary. This test is used for clinical purposes. It should not be regarded as investigational or for research. This laboratory is certified under the Clinical Laboratory Improvement Amendments of 1988 (CLIA-88) as qualified to perform high complexity clinical laboratory testing. Estrogen receptor (6F11), immunohistochemical stains are performed on formalin fixed, paraffin embedded tissue using a 3,3"-diaminobenzidine (DAB) chromogen and Leica Bond Autostainer System. The staining intensity of the nucleus is scored manually and is reported as the percentage of tumor cell nuclei demonstrating specific nuclear staining.Specimens are fixed in 10% Neutral Buffered Formalin for at least 6 hours and up to 72 hours. These tests have not be validated on decalcified tissue. Results should be interpreted with caution given the possibility of false negative results on decalcified specimens. Ki-67 (MM1), immunohistochemical stains are performed on formalin fixed, paraffin embedded tissue using a 3,3"-diaminobenzidine (DAB) chromogen and Leica Bond Autostainer System. The staining intensity of the nucleus is scored manually and is reported as the percentage of tumor cell nuclei demonstrating specific nuclear staining.Specimens are fixed in 10% Neutral Buffered Formalin for at least 6 hours and up to 72 hours. These tests have not be validated on decalcified tissue. Results should be interpreted with caution given the possibility of false negative results on decalcified specimens. PR progesterone receptor (16), immunohistochemical stains are performed on formalin fixed, paraffin embedded tissue using a 3,3"-diaminobenzidine (DAB) chromogen and Leica Bond Autostainer System. The staining intensity of the nucleus is scored manually  and is reported as the percentage of tumor cell nuclei demonstrating specific nuclear staining.Specimens are fixed in 10% Neutral Buffered Formalin for at least 6 hours and up to 72 hours. These tests have not be validated on decalcified tissue. Results should be interpreted with caution given the possibility of false negative results on decalcified specimens.  ADDENDUM 1A) Breast, right, needle core biopsy, 10 o'clock, 4cmfn, ribbon clip PROGNOSTIC INDICATORS  Results: IMMUNOHISTOCHEMICAL AND MORPHOMETRIC ANALYSIS PERFORMED MANUALLY The tumor cells are NEGATIVE for Her2 (1+). Estrogen Receptor: 95%, POSITIVE, STRONG STAINING INTENSITY Progesterone Receptor: 5%, POSITIVE, STRONG STAINING INTENSITY Proliferation Marker Ki67: 30% REFERENCE RANGE ESTROGEN RECEPTOR NEGATIVE 0% POSITIVE =>1% REFERENCE RANGE PROGESTERONE RECEPTOR NEGATIVE 0% POSITIVE =>1% All controls stained appropriately Arville Care, Zhaoli, Sports administrator, International aid/development worker ( Signed 09 18 2024)   Assessment and Plan:   Diagnoses and all orders for this visit:  Malignant neoplasm of upper-outer quadrant of right breast in female, estrogen receptor positive (CMS/HHS-HCC)   Discussed breast conserving surgery as well as mastectomy with reconstruction and surgical options. Reviewed the pathophysiology of breast cancer in the multidisciplinary team approach. She wishes to see medical and radiation oncology at [facility] and I will make that referral.  After discussion of the pros and cons of surgical interventions, local regional recurrence rates of each, overall survival and quality of life issues, she is opted for right breast seed localized lumpectomy with right axillary sentinel lymph node mapping. Reviewed the procedure as well as complications of bleeding, infection, cosmetic deformity, numbness,  pain, lymphedema, injury to major vascular structures and the need for re excisional surgery.   Hayden Rasmussen, MD

## 2022-10-18 NOTE — Transfer of Care (Signed)
Immediate Anesthesia Transfer of Care Note  Patient: Candace Espinoza  Procedure(s) Performed: RIGHT BREAST SEED LUMPECTOMY, RIGHT SENTINEL LYMPH NODE MAPPING (Right: Breast)  Patient Location: PACU  Anesthesia Type:General  Level of Consciousness: awake, alert , and oriented  Airway & Oxygen Therapy: Patient Spontanous Breathing and Patient connected to face mask oxygen  Post-op Assessment: Report given to RN and Post -op Vital signs reviewed and stable  Post vital signs: Reviewed and stable  Last Vitals:  Vitals Value Taken Time  BP    Temp    Pulse    Resp    SpO2      Last Pain:  Vitals:   10/18/22 1107  TempSrc: Tympanic  PainSc: 0-No pain      Patients Stated Pain Goal: 5 (10/18/22 1107)  Complications: No notable events documented.

## 2022-10-18 NOTE — Op Note (Signed)
Preoperative diagnosis: Stage I right breast cancer upper outer quadrant ER positive  Postoperative diagnosis: Same  Procedure: Right breast seed localized lumpectomy with deep right axillary sentinel lymph node mapping using mag trace  Surgeon: Harriette Bouillon, MD  Anesthesia: LMA with pectoral block and 0.25% Marcaine with epinephrine  EBL: 20 cc  Specimen: Right breast tissue with seed and clip verified by Faxitron, 5 right axillary sentinel nodes  Indications for procedure: The patient is a 52 year old female with stage I right breast cancer.  She was seen in the multidisciplinary clinic setting and opted for breast conserving surgery.The procedure has been discussed with the patient. Alternatives to surgery have been discussed with the patient.  Risks of surgery include bleeding,  Infection,  Seroma formation, death,  and the need for further surgery.   The patient understands and wishes to proceed. Sentinel lymph node mapping and dissection has been discussed with the patient.  Risk of bleeding,  Infection,  Seroma formation,  Additional procedures,,  Shoulder weakness ,  Shoulder stiffness,  Nerve and blood vessel injury and reaction to the mapping dyes have been discussed.  Alternatives to surgery have been discussed with the patient.  The patient agrees to proceed.     Description of procedure: The patient was met in the holding area and questions were answered.  The right breast was marked as the correct site.  She underwent seed placement as an outpatient and pectoral block was performed by anesthesia.  She was taken back to the operating room.  She was placed supine upon the operative room table.  After induction of general anesthesia, 2 cc of mag trace were injected under sterile conditions in the right retroareolar deep breast tissue.  This was massaged for 5 minutes.  The patient was then prepped and draped in sterile fashion and timeout performed.  Neoprobe used to identify the seed  right breast upper outer quadrant.  A curvilinear incision was made of the signal.  Dissection was carried down and all tissue around the seed and clip were excised with a grossly negative margin.  This was oriented with ink.  The images revealed both seed and clip to be present.  The cavities made hemostatic with cautery.  Irrigation was used and a small amount of local anesthetic was infiltrated around the cavity.  Clips were used to mark the cavity the cavities closed with 3-0 Vicryl and 4-0 Monocryl.  Mag trace probe used.  Hotspot identified the right axilla.  A transverse incision was made in the right axillary hairline.  Dissection was carried down into the level 1 deep contents.  There were 5 nodes that had signal.  Background's did not show any evidence of signal spike.  Long thoracic nerve, thoracodorsal trunk and extra vein were all preserved.  Irrigation used.  Hemostasis achieved with cautery and Arista.  Deep tissue planes closed with 3-0 Vicryl.  4 Monocryl used to close the skin in a subcuticular fashion.  Dermabond applied.  All counts found to be correct.  The patient was awoke extubated taken to recovery in satisfactory condition.

## 2022-10-18 NOTE — Discharge Instructions (Addendum)
[facility] Office Phone Number [phone]  BREAST BIOPSY/ PARTIAL MASTECTOMY: POST OP INSTRUCTIONS  Always review your discharge instruction sheet given to you by the facility where your surgery was performed.  IF YOU HAVE DISABILITY OR FAMILY LEAVE FORMS, YOU MUST BRING THEM TO THE OFFICE FOR PROCESSING.  DO NOT GIVE THEM TO YOUR DOCTOR.  A prescription for pain medication may be given to you upon discharge.  Take your pain medication as prescribed, if needed.  If narcotic pain medicine is not needed, then you may take acetaminophen (Tylenol) or ibuprofen (Advil) as needed. Take your usually prescribed medications unless otherwise directed If you need a refill on your pain medication, please contact your pharmacy.  They will contact our office to request authorization.  Prescriptions will not be filled after 5pm or on week-ends. You should eat very light the first 24 hours after surgery, such as soup, crackers, pudding, etc.  Resume your normal diet the day after surgery. Most patients will experience some swelling and bruising in the breast.  Ice packs and a good support bra will help.  Swelling and bruising can take several days to resolve.  It is common to experience some constipation if taking pain medication after surgery.  Increasing fluid intake and taking a stool softener will usually help or prevent this problem from occurring.  A mild laxative (Milk of Magnesia or Miralax) should be taken according to package directions if there are no bowel movements after 48 hours. Unless discharge instructions indicate otherwise, you may remove your bandages 24-48 hours after surgery, and you may shower at that time.  You may have steri-strips (small skin tapes) in place directly over the incision.  These strips should be left on the skin for 7-10 days.  If your surgeon used skin glue on the incision, you may shower in 24 hours.  The glue will flake off over the next 2-3 weeks.  Any  sutures or staples will be removed at the office during your follow-up visit. ACTIVITIES:  You may resume regular daily activities (gradually increasing) beginning the next day.  Wearing a good support bra or sports bra minimizes pain and swelling.  You may have sexual intercourse when it is comfortable. You may drive when you no longer are taking prescription pain medication, you can comfortably wear a seatbelt, and you can safely maneuver your car and apply brakes. RETURN TO WORK:  ______________________________________________________________________________________ Candace Espinoza should see your doctor in the office for a follow-up appointment approximately two weeks after your surgery.  Your doctor's nurse will typically make your follow-up appointment when she calls you with your pathology report.  Expect your pathology report 2-3 business days after your surgery.  You may call to check if you do not hear from [place] after three days. OTHER INSTRUCTIONS: _______________________________________________________________________________________________ _____________________________________________________________________________________________________________________________________ _____________________________________________________________________________________________________________________________________ _____________________________________________________________________________________________________________________________________  WHEN TO CALL YOUR DOCTOR: Fever over 101.0 Nausea and/or vomiting. Extreme swelling or bruising. Continued bleeding from incision. Increased pain, redness, or drainage from the incision.  The clinic staff is available to answer your questions during regular business hours.  Please don't hesitate to call and ask to speak to one of the nurses for clinical concerns.  If you have a medical emergency, go to the nearest emergency room or call 911.  A surgeon from [facility] is always on call at the hospital.  For further questions, please visit [email or web address]    May have Tylenol at 5:15pm if needed.  Post Anesthesia Home Care Instructions  Activity:  Get plenty of rest for the remainder of the day. A responsible individual must stay with you for 24 hours following the procedure.  For the next 24 hours, DO NOT: -Drive a car -Advertising copywriter -Drink alcoholic beverages -Take any medication unless instructed by your physician -Make any legal decisions or sign important papers.  Meals: Start with liquid foods such as gelatin or soup. Progress to regular foods as tolerated. Avoid greasy, spicy, heavy foods. If nausea and/or vomiting occur, drink only clear liquids until the nausea and/or vomiting subsides. Call your physician if vomiting continues.  Special Instructions/Symptoms: Your throat may feel dry or sore from the anesthesia or the breathing tube placed in your throat during surgery. If this causes discomfort, gargle with warm salt water. The discomfort should disappear within 24 hours.  If you had a scopolamine patch placed behind your ear for the management of post- operative nausea and/or vomiting:  1. The medication in the patch is effective for 72 hours, after which it should be removed.  Wrap patch in a tissue and discard in the trash. Wash hands thoroughly with soap and water. 2. You may remove the patch earlier than 72 hours if you experience unpleasant side effects which may include dry mouth, dizziness or visual disturbances. 3. Avoid touching the patch. Wash your hands with soap and water after contact with the patch.

## 2022-10-19 ENCOUNTER — Encounter (HOSPITAL_BASED_OUTPATIENT_CLINIC_OR_DEPARTMENT_OTHER): Payer: Self-pay | Admitting: Surgery

## 2022-10-19 NOTE — Anesthesia Postprocedure Evaluation (Signed)
Anesthesia Post Note  Patient: Candace Espinoza  Procedure(s) Performed: RIGHT BREAST SEED LUMPECTOMY, RIGHT SENTINEL LYMPH NODE MAPPING (Right: Breast)     Patient location during evaluation: PACU Anesthesia Type: General Level of consciousness: awake and alert Pain management: pain level controlled Vital Signs Assessment: post-procedure vital signs reviewed and stable Respiratory status: spontaneous breathing, nonlabored ventilation, respiratory function stable and patient connected to nasal cannula oxygen Cardiovascular status: blood pressure returned to baseline and stable Postop Assessment: no apparent nausea or vomiting Anesthetic complications: no   No notable events documented.  Last Vitals:  Vitals:   10/18/22 1454 10/18/22 1510  BP: 114/77 133/85  Pulse:  82  Resp:  16  Temp:  (!) 36.3 C  SpO2:  100%    Last Pain:  Vitals:   10/18/22 1510  TempSrc:   PainSc: 5                  Mariann Barter

## 2022-10-22 ENCOUNTER — Encounter: Payer: Self-pay | Admitting: Surgery

## 2022-10-22 LAB — SURGICAL PATHOLOGY

## 2022-10-22 NOTE — Plan of Care (Signed)
[facility] Tonsillectomy/Adenoidectomy, Postoperative PEDS care plan entered in error.

## 2022-11-07 ENCOUNTER — Encounter (HOSPITAL_COMMUNITY): Payer: Self-pay

## 2022-11-12 ENCOUNTER — Telehealth: Payer: Self-pay | Admitting: Radiation Oncology

## 2022-11-12 NOTE — Telephone Encounter (Signed)
Called patient to schedule a consultation w. Dr. Basilio Cairo, patient requested appointment delay due to work schedule and going out of town for the upcoming holiday.

## 2022-12-03 NOTE — Progress Notes (Signed)
Radiation Oncology         [phone] ________________________________  Initial Outpatient Consultation  Name: Candace Espinoza MRN: [id number]  Date: 12/04/2022  DOB: [birth year]  YQ:MVHQION, Neil Crouch, MD   REFERRING PHYSICIAN: Harriette Bouillon, MD  DIAGNOSIS: No diagnosis found.   Cancer Staging  No matching staging information was found for the patient.  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 female who presented with a right breast abnormality on a screening mammogram several months ago (date unknown). No symptoms, if any, were reported at that time. She subsequently underwent a right breast diagnostic mammogram and right breast ultrasound on 09/12/22 which demonstrated a highly suspicious mass in the 10 o'clock right breast measuring approximately 9 mm. No evidence of right axillary lymphadenopathy was demonstrated.   Biopsy of the 10 o'clock right breast (4 cmfn) on 09/17/22 showed grade 3 invasive ductal carcinoma measuring 7 mm in the greatest linear extent of the sample, with intermediate to high grade DCIS and calcifications.  ER status: 95% positive and PR status 5% positive, both with strong staining intensity; Proliferation marker Ki67 at 30%; Her2 status negative; Grade 3. No lymph nodes were examined.   She was accordingly referred to general surgery and opted to proceed with a right breast lumpectomy and right axillary SLN biopsies on 10/18/22 under the care of Dr. Luisa Hart. Pathology from the procedure revealed: tumor the size of 1.1 cm; histology of grade 3 invasive ductal carcinoma with intermediate to high-grade DCIS; negative for LVI and PNI; all  margins negative for invasive and in situ carcinoma; margin status to invasive and in situ disease of >10 mm from the closest margin; nodal status of 6/6 right axillary sentinel lymph node excisions negative for carcinoma. ER status: 95% positive and PR status 5% positive, both with strong staining intensity; Proliferation marker Ki67 at 30%; Her2 status negative; Grade 3.   Oncotype DX was obtained on the final surgical sample and the recurrence score of 27 predicts a risk of recurrence outside the breast over the next 9 years of 16%, if the patient's only systemic therapy is an antiestrogen for 5 years.  It also predicts a significant benefit from chemotherapy (greater than 15% benefit).   She was subsequently referred to Dr. Lorne Skeens at [facility] on 10/26/22 to discuss adjuvant treatment options. Based on her Oncotype results, she has been recommended adjuvant chemotherapy consisting of TC x 4 cycles with peg-filgrastim support for prophylaxis of chemotherapy induced neutropenia. She has not yet made a final decision to pursue chemotherapy and is reflecting on her options.    Post-operatively, she developed a painful right axillary seroma. This was noted to be smaller during her most recent evaluation by Dr. Luisa Hart on 11/23/22. Dr. Luisa Hart also aspirated the seroma at that time which yielded approximately 10 cc's of serous fluid. She was also prescribed gabapentin to take as needed for any discomfort.   ***  PREVIOUS RADIATION THERAPY: No  PAST MEDICAL HISTORY:  has a past medical history of Anemia, Anxiety, Esophagitis, and GERD (gastroesophageal reflux disease).    PAST SURGICAL HISTORY: Past Surgical History:  Procedure Laterality Date   BREAST BIOPSY Right 09/17/2022  [place] RT BREAST BX W LOC DEV 1ST LESION IMG BX SPEC US GUIDE 09/17/2022 [facility]   BREAST BIOPSY  10/17/2022   MM RT RADIOACTIVE SEED LOC MAMMO GUIDE 10/17/2022 [facility]   BREAST LUMPECTOMY  WITH RADIOACTIVE SEED AND SENTINEL LYMPH NODE BIOPSY Right 10/18/2022   Procedure: RIGHT BREAST SEED LUMPECTOMY, RIGHT SENTINEL LYMPH NODE MAPPING;  Surgeon: Harriette Bouillon, MD;  Location: [facility];  Service: General;  Laterality: Right;  PEC BLOCK   DILITATION & CURRETTAGE/HYSTROSCOPY WITH NOVASURE ABLATION N/A 11/24/2014   Procedure: Hysteroscopy DILATATION & CURETTAGE  WITH Failed  NOVASURE ABLATION;  Surgeon: Genia Del, MD;  Location: [facility];  Service: Gynecology;  Laterality: N/A;   UPPER GI ENDOSCOPY     WRIST GANGLION EXCISION     Right    FAMILY HISTORY: family history includes Cancer in her maternal grandmother, paternal grandfather, and paternal grandmother; Colon cancer in her maternal grandfather; Diabetes in an other family member; Hypertension in an other family member; Prostate cancer in her father.  SOCIAL HISTORY:  reports that she has quit smoking. Her smoking use included cigarettes. She has a 2 pack-year smoking history. She has never used smokeless tobacco. She reports current alcohol use. She reports that she does not use drugs.  ALLERGIES: Codeine and Doxycycline  MEDICATIONS:  Current Outpatient Medications  Medication Sig Dispense Refill   ALPRAZolam (XANAX) 0.25 MG tablet Take 1 tablet (0.25 mg total) by mouth 3 (three) times daily as needed for anxiety. 30 tablet 2   Calcium-Magnesium-Vitamin D (CALCIUM MAGNESIUM PO) Take 2 tablets by mouth daily.     Cholecalciferol (VITAMIN D PO) Take 1 tablet by mouth daily.     Ciclopirox 1 % shampoo Massage into scalp and let sit 3 min then rinse--- use 2x a week x 4 weeks 120 mL 1   cyclobenzaprine (FLEXERIL) 10 MG tablet Take 1 tablet (10 mg total) by mouth 3 (three) times daily as needed for muscle spasms. (Patient not taking: Reported on 11/22/2014) 30 tablet 0   meclizine (ANTIVERT) 25 MG tablet Take 1 tablet (25 mg total) by mouth 3 (three) times daily as needed for dizziness. (Patient not taking:  Reported on 11/22/2014) 30 tablet 0   oxyCODONE (OXY IR/ROXICODONE) 5 MG immediate release tablet Take 1 tablet (5 mg total) by mouth every 6 (six) hours as needed for severe pain (pain score 7-10). 15 tablet 0   Probiotic Product (PROBIOTIC PO) Take 1 capsule by mouth daily.      vitamin B-12 (CYANOCOBALAMIN) 100 MCG tablet Take 100 mcg by mouth daily.     No current facility-administered medications for this encounter.    REVIEW OF SYSTEMS: As above in HPI.   PHYSICAL EXAM:  vitals were not taken for this visit.   General: Alert and oriented, in no acute distress HEENT: Head is normocephalic. Extraocular movements are intact. Oropharynx is clear. Neck: Neck is supple, no palpable cervical or supraclavicular lymphadenopathy. Heart: Regular in rate and rhythm with no murmurs, rubs, or gallops. Chest: Clear to auscultation bilaterally, with no rhonchi, wheezes, or rales. Abdomen: Soft, nontender, nondistended, with no rigidity or guarding. Extremities: No cyanosis or edema. Lymphatics: see Neck Exam Skin: No concerning lesions. Musculoskeletal: symmetric strength and muscle tone throughout. Neurologic: Cranial nerves II through XII are grossly intact. No obvious focalities. Speech is fluent. Coordination is intact. Psychiatric: Judgment and insight are intact. Affect is appropriate. Breasts: *** . No other palpable masses appreciated in the breasts or axillae *** .  ECOG = ***  0 - Asymptomatic (Fully active, able to carry on all predisease activities without restriction)  1 - Symptomatic but completely ambulatory (Restricted in physically strenuous activity but ambulatory and able to carry out work of a light or sedentary nature. For example, light housework, office work)  2 - Symptomatic, <50% in bed during the day (Ambulatory and capable of all self care but unable to carry out any work activities. Up and about more than 50% of waking hours)  3 - Symptomatic, >50% in bed, but  not bedbound (Capable of only limited self-care, confined to bed or chair 50% or more of waking hours)  4 - Bedbound (Completely disabled. Cannot carry on any self-care. Totally confined to bed or chair)  5 - Death   Santiago Glad MM, Creech RH, Tormey DC, et al. [phone]). "Toxicity and response criteria of the [facility] Group". Am. Evlyn Clines. Oncol. 5 (6): 649-55   LABORATORY DATA:  Lab Results  Component Value Date   WBC 8.3 11/24/2014   HGB 11.5 (L) 11/24/2014   HCT 34.3 (L) 11/24/2014   MCV 93.7 11/24/2014   PLT 305 11/24/2014   CMP     Component Value Date/Time   NA 138 06/23/2010 1529   K 4.7 06/23/2010 1529   CL 99 06/23/2010 1529   CO2 26 06/23/2010 1529   GLUCOSE 156 (H) 06/23/2010 1529   GLUCOSE 86 10/24/2005 1040   BUN 12 06/23/2010 1529   CREATININE 0.64 06/23/2010 1529   CALCIUM 9.7 06/23/2010 1529   PROT 7.3 06/23/2010 1529   ALBUMIN 4.8 06/23/2010 1529   AST 21 06/23/2010 1529   ALT 17 06/23/2010 1529   ALKPHOS 60 06/23/2010 1529   BILITOT 0.5 06/23/2010 1529   GFRNONAA 136.56 07/29/2009 0000   GFRAA  10/18/2008 1318    >60        The eGFR has been calculated using the MDRD equation. This calculation has not been validated in all clinical situations. eGFR's persistently <60 mL/min signify possible Chronic Kidney Disease.         RADIOGRAPHY: No results found.    IMPRESSION/PLAN: ***   It was a pleasure meeting the patient today. We discussed the risks, benefits, and side effects of radiotherapy. I recommend radiotherapy to the *** to reduce her risk of locoregional recurrence by 2/3.  We discussed that radiation would take approximately *** weeks to complete and that I would give the patient a few weeks to heal following surgery before starting treatment planning. *** If chemotherapy were to be given, this would precede radiotherapy. We spoke about acute effects including skin irritation and fatigue as well as much less common late effects  including internal organ injury or irritation. We spoke about the latest technology that is used to minimize the risk of late effects for patients undergoing radiotherapy to the breast or chest wall. No guarantees of treatment were given. The patient is enthusiastic about proceeding with treatment. I look forward to participating in the patient's care.  I will await her referral back to me for postoperative follow-up and eventual CT simulation/treatment planning.  On date of service, in total, I spent *** minutes on this encounter. Patient was seen in person.   __________________________________________   Lonie Peak, MD  This document serves as a record of services personally performed by Lonie Peak, MD. It was created on her behalf by Neena Rhymes, a trained medical scribe. The creation of this record is based on the scribe's personal  observations and the provider's statements to them. This document has been checked and approved by the attending provider.

## 2022-12-04 ENCOUNTER — Ambulatory Visit
Admission: RE | Admit: 2022-12-04 | Discharge: 2022-12-04 | Disposition: A | Discharge status: Home / Self Care | Payer: 59 | Source: Ambulatory Visit | Attending: Radiation Oncology | Admitting: Radiation Oncology

## 2022-12-04 ENCOUNTER — Encounter: Payer: Self-pay | Admitting: Radiation Oncology

## 2022-12-04 VITALS — BP 122/79 | HR 87 | Temp 97.2°F | Resp 18 | Ht 62.0 in | Wt 131.6 lb

## 2022-12-04 DIAGNOSIS — F419 Anxiety disorder, unspecified: Secondary | ICD-10-CM | POA: Diagnosis not present

## 2022-12-04 DIAGNOSIS — Z170 Estrogen receptor positive status [ER+]: Secondary | ICD-10-CM | POA: Insufficient documentation

## 2022-12-04 DIAGNOSIS — K219 Gastro-esophageal reflux disease without esophagitis: Secondary | ICD-10-CM | POA: Diagnosis not present

## 2022-12-04 DIAGNOSIS — C50411 Malignant neoplasm of upper-outer quadrant of right female breast: Secondary | ICD-10-CM | POA: Insufficient documentation

## 2022-12-04 DIAGNOSIS — D649 Anemia, unspecified: Secondary | ICD-10-CM | POA: Diagnosis not present

## 2022-12-04 DIAGNOSIS — K209 Esophagitis, unspecified without bleeding: Secondary | ICD-10-CM | POA: Insufficient documentation

## 2022-12-04 DIAGNOSIS — Z87891 Personal history of nicotine dependence: Secondary | ICD-10-CM | POA: Insufficient documentation

## 2022-12-04 NOTE — Progress Notes (Signed)
Location of Breast Cancer:  Malignant neoplasm of upper-outer quadrant of right breast in female, estrogen receptor positive  Histology per Pathology Espinoza:  10/18/22  SURGICAL PATHOLOGY CASE: [id number] PATIENT: Candace Espinoza     Clinical History: right breast cancer (cm)     FINAL MICROSCOPIC DIAGNOSIS:  A. 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 LYMPH NODE, RIGHT AXILLARY, SENTINEL, EXCISION: - Lymph node, negative for carcinoma (0/1)  C. LYMPH NODE, RIGHT AXILLARY, SENTINEL, EXCISION: - Lymph node, negative for carcinoma (0/1)  D. LYMPH NODE, RIGHT AXILLARY, SENTINEL, EXCISION: - Lymph node, negative for carcinoma (0/1)  E. LYMPH NODE, RIGHT AXILLARY, SENTINEL, EXCISION: - Lymph node, negative for carcinoma (0/1)  F. LYMPH NODE, RIGHT AXILLARY, SENTINEL, EXCISION: - Lymph node, negative for carcinoma (0/1)  G. LYMPH NODE, RIGHT AXILLARY, SENTINEL, EXCISION: - Lymph node, negative for carcinoma (0/1)     ONCOLOGY TABLE:  Procedure: Lumpectomy Specimen Laterality: Right Histologic Type: Invasive ductal carcinoma Histologic Grade:      Glandular (Acinar)/Tubular Differentiation: 3      Nuclear Pleomorphism: 3      Mitotic Rate: 2      Overall Grade: 3 Tumor Size: 1.1 cm Ductal Carcinoma In Situ: Present, intermediate to high-grade Treatment Effect in the Breast: No known presurgical therapy Margins: All margins negative for invasive carcinoma      Distance from Closest Margin (mm): Greater than 10 mm DCIS Margins: Uninvolved by DCIS      Distance from Closest Margin (mm): Greater than 10 mm Regional Lymph Nodes:      Number of Lymph Nodes Examined: 6      Number of  Sentinel Nodes Examined: 6      Number of Lymph Nodes with Macrometastases (>2 mm): 0      Number of Lymph Nodes with Micrometastases: 0      Number of Lymph Nodes with Isolated Tumor Cells (=0.2 mm or =200 cells): 0      Size of Largest Metastatic Deposit (mm): Not applicable      Extranodal Extension: Not applicable Distant Metastasis:      Distant Site(s) Involved: Not applicable Breast Biomarker Testing Performed on Previous Biopsy:      Testing Performed on Case Number: [id number]            Estrogen Receptor: 95%, positive, strong staining intensity            Progesterone Receptor: 5%, positive, strong staining intensity            HER2: Negative (1+)            Ki-67: 30% Pathologic Stage Classification (pTNM, AJCC 8th Edition): pT1c, pN0 Representative Tumor Block: A1 Comment(s): None  (v4.5.0.0)   GROSS DESCRIPTION:  A.  Specimen type: Received fresh is a clinically right lumpectomy specimen with radiographic seed that is identified and removed.  The specimen is placed in formalin at 2:33 PM on 10/18/2022. Size: 6.5 x 6.0 x 3.1 cm (M-L x A-P x S-I) Orientation: Anterior = green, posterior = black, superior = red, inferior = blue, medial = yellow, and lateral = orange Cut surface: The specimen is sectioned from medial to lateral and a Faxitron image is taken revealing a ribbon clip within a white-tan, firm, stellate lesion  that measures 1.1 x 1.0 x 0.8 cm Margins: The lesion is 1.2 cm from the superior margin, 1.5 cm from the anterior margin, 2.0 cm from the posterior margin, 1.4 cm from the inferior margin, 3.5 cm from the lateral margin, and 2.5 cm from the medial margin. Block summary: A1 central lesion at clip site, no true margin A2 additional representative lesion A3 superior margin closest to lesion A4 inferior margin closest to lesion A5 anterior margin closest to lesion A6 posterior margin closest to lesion A7 medial margin A8 lateral margin   B.   Received fresh is a 0.4 x 0.3 x 0.2 cm lymph node candidate.  The candidate is entirely submitted in 1 block.  C.  Received fresh is a 0.3 x 0.2 x 0.1 cm lymph node candidate.  The candidate is entirely submitted in 1 block.  D.  Received fresh is a 0.4 x 0.3 x 0.2 cm lymph node candidate.  The candidate is entirely submitted in 1 block.  E.  Received fresh is a 0.3 x 0.2 x 0.2 cm lymph node candidate.  The candidate is entirely su   09/17/22 FINAL DIAGNOSIS       1. Breast, right, needle core biopsy, 10 o'clock, 4cmfn, ribbon clip :      - INVASIVE DUCTAL CARCINOMA, SEE NOTE      - DUCTAL CARCINOMA IN SITU, INTERMEDIATE TO HIGH GRADE      - TUBULE FORMATION: SCORE 3      - NUCLEAR PLEOMORPHISM: SCORE 3      - MITOTIC COUNT: SCORE 2      - TOTAL SCORE: 8      - OVERALL GRADE: 3      - LYMPHOVASCULAR INVASION: NOT IDENTIFIED      - CANCER LENGTH: 0.7 CM      - CALCIFICATIONS: PRESENT      - OTHER FINDINGS: NONE            NOTE:      DR. Kenard Gower REVIEWED THE CASE AND CONCURS WITH THE INTERPRETATION.  A BREAST      PROGNOSTIC PROFILE (ER, PR, KI-67 AND HER2) IS PENDING AND WILL BE REPORTED IN      AN ADDENDUM.  THE [facility] OF [facility] Esp[facility] WAS NOTIFIED ON 09/18/2022.       DATE SIGNED OUT: 09/18/2022 ELECTRONIC SIGNATURE [place] Callas M.D., Nupur, Pathologist, Electronic Signature  MICROSCOPIC DESCRIPTION  CASE COMMENTS STAINS USED IN DIAGNOSIS: H&E-2 H&E-3 H&E-4 H&E *RECUT 1 SLIDE Universal Negative Control-DAB Universal Negative Control-DAB Universal Negative Control-DAB Stains used in diagnosis 1 Her2 by IHC, 1 ER-ACIS, 1 KI-67-ACIS, 1 PR-ACIS IHC scores are reported using ASCO/CAP scoring criteria.  An IHC Score of 0 or 1+  is NEGATIVE for HER2, 3+ is POSITIVE for HER2, and 2+ is EQUIVOCAL. Equivocal results are reflexed to either FISH or IHC testing. Specimens are fixed in 10% Neutral Buffered Formalin for at least 6 hours and up to 72 hours. These tests have  not be validated on decalcified tissue.  Results should be interpreted with caution given the possibility of false negative results on decalcified specimens. Antibody Clone for HER2 is 4B5 (PATHWAY). Some of these immunohistochemical stains may have been developed and the performance characteristics determined by [facility].  Some may not have been cleared or approved by the U.S. Food and Drug Administration.  The FDA has determined that such clearance or approval is not necessary.  This test is used for clinical purposes.  It  should not be regarded as investigational or for research.  This laboratory is certified under the Clinical Laboratory Improvement Amendments of 1988 (CLIA-88) as qualified to perform high complexity clinical laboratory testing. Estrogen receptor (6F11), immunohistochemical stains are performed on formalin fixed, paraffin embedded tissue using a 3,3"-diaminobenzidine (DAB) chromogen and Leica Bond Autostainer System.  The staining intensity of the nucleus is scored manually and is reported as the percentage of tumor cell nuclei demonstrating specific nuclear staining.Specimens are fixed in 10% Neutral Buffered Formalin for at least 6 hours and up to 72 hours.  These tests have not be validated on decalcified tissue.  Results should be interpreted with caution given the possibility of false negative results on decalcified specimens. Ki-67 (MM1), immunohistochemical stains are performed on formalin fixed, paraffin embedded tissue using a 3,3"-diaminobenzidine (DAB) chromogen and Leica Bond Autostainer System.  The staining intensity of the nucleus is scored manually and is reported as the percentage of tumor cell nuclei demonstrating specific nuclear staining.Specimens are fixed in 10% Neutral Buffered Formalin for at least 6 hours and up to 72 hours. These tests have not be validated on decalcified tissue.  Results should be interpreted with caution given  the possibility of false negative results on decalcified specimens. PR progesterone receptor (16), immunohistochemical stains are performed on formalin fixed, paraffin embedded tissue using a 3,3"-diaminobenzidine (DAB) chromogen and Leica Bond Autostainer System.  The staining intensity of the nucleus is scored manually and is reported as the percentage of tumor cell nuclei demonstrating specific nuclear staining.Specimens are fixed in 10% Neutral Buffered Formalin for at least 6 hours and up to 72 hours. These tests have not be validated on decalcified tissue.  Results should be interpreted with caution given the possibility of false negative results on decalcified specimens.  ADDENDUM 1A) Breast, right, needle core biopsy, 10 o'clock, 4cmfn, ribbon clip PROGNOSTIC INDICATORS  Results: IMMUNOHISTOCHEMICAL AND MORPHOMETRIC ANALYSIS PERFORMED MANUALLY The tumor cells are NEGATIVE for Her2 (1+). Estrogen Receptor:  95%, POSITIVE, STRONG STAINING INTENSITY Progesterone Receptor:  5%, POSITIVE, STRONG STAINING INTENSITY Proliferation Marker Ki67:  30% REFERENCE RANGE ESTROGEN RECEPTOR NEGATIVE     0% POSITIVE       =>1% REFERENCE RANGE PROGESTERONE RECEPTOR NEGATIVE     0% POSITIVE        =>1% All controls stained appropriately Candace Espinoza, Candace Espinoza, Sports administrator, Electronic Signature ( Signed 09 18 2024)   CLINICAL HISTORY  SPECIMEN(S) OBTAINED 1. Breast, right, needle core biopsy, 10 O'clock, 4cmfn, Ribbon Clip  SPECIMEN COMMENTS: 1. TIF: 8:05 AM, CIT < 1 min; screening detected right breast mass SPECIMEN CLINICAL INFORMATION: 1. Suspicious for malignancy    Gross Description 1. "RT Breast mass, 10:00, 4 cmfn", received in formalin are five fibroadipose tissue cores ranging from 0.3 to 1.6 cm in length, each with an average diameter of 0.2 cm. The specimen is submitted in toto in 1 block (1A).      Time in formalin: 09/17/22 at 0805      Cold ischemia time: Less than 1  minute      Localization: Ribbon clip      AMG 09/17/2022    Receptor Status: Results: IMMUNOHISTOCHEMICAL AND MORPHOMETRIC ANALYSIS PERFORMED MANUALLY The tumor cells are NEGATIVE for Her2 (1+). Estrogen Receptor:  95%, POSITIVE, STRONG STAINING INTENSITY Progesterone Receptor:  5%, POSITIVE, STRONG STAINING INTENSITY Proliferation Marker Ki67:  30% REFERENCE RANGE ESTROGEN RECEPTOR NEGATIVE     0% POSITIVE       =>1% REFERENCE RANGE PROGESTERONE RECEPTOR NEGATIVE  0% POSITIVE        =>1% All controls stained appropriately Candace Espinoza, Candace Espinoza, Pathologist, [employer]   Did patient present with symptoms (if so, please note symptoms) or was this found on screening mammography?: mammogram  Past/Anticipated interventions by surgeon, if any: 10/18/22 RIGHT BREAST SEED LUMPECTOMY, RIGHT SENTINEL LYMPH NODE MAPPING  Candace Espinoza, Sharl Ma, Candace Espinoza   Past/Anticipated interventions by medical oncology, if any: Chemotherapy   Lymphedema issues, if any:  yes ,states that she is having issues with range of motion   Pain issues, if any:   states that she has pain in her breast  area  SAFETY ISSUES: Prior radiation? yes Pacemaker/ICD? no Possible current pregnancy?no  Is the patient on methotrexate? no  Current Complaints / other details:    Patient denies any other issues.    Vitals:   12/04/22 1011  BP: 122/79  Pulse: 87  Resp: 18  Temp: (!) 97.2 F (36.2 C)  SpO2: 100%  Weight: 59.7 kg  Height: 5\' 2"  (1.575 m)

## 2022-12-05 ENCOUNTER — Other Ambulatory Visit: Payer: 59

## 2022-12-10 ENCOUNTER — Encounter: Payer: Self-pay | Admitting: *Deleted

## 2022-12-10 ENCOUNTER — Other Ambulatory Visit: Payer: 59

## 2022-12-10 ENCOUNTER — Inpatient Hospital Stay: Payer: 59 | Attending: Hematology and Oncology | Admitting: Hematology and Oncology

## 2022-12-10 ENCOUNTER — Other Ambulatory Visit: Payer: Self-pay

## 2022-12-10 VITALS — BP 120/75 | HR 92 | Temp 98.7°F | Resp 17 | Wt 131.4 lb

## 2022-12-10 DIAGNOSIS — K2100 Gastro-esophageal reflux disease with esophagitis, without bleeding: Secondary | ICD-10-CM | POA: Insufficient documentation

## 2022-12-10 DIAGNOSIS — Z8042 Family history of malignant neoplasm of prostate: Secondary | ICD-10-CM | POA: Insufficient documentation

## 2022-12-10 DIAGNOSIS — Z881 Allergy status to other antibiotic agents status: Secondary | ICD-10-CM | POA: Insufficient documentation

## 2022-12-10 DIAGNOSIS — Z8249 Family history of ischemic heart disease and other diseases of the circulatory system: Secondary | ICD-10-CM | POA: Insufficient documentation

## 2022-12-10 DIAGNOSIS — Z170 Estrogen receptor positive status [ER+]: Secondary | ICD-10-CM | POA: Insufficient documentation

## 2022-12-10 DIAGNOSIS — Z1721 Progesterone receptor positive status: Secondary | ICD-10-CM | POA: Diagnosis not present

## 2022-12-10 DIAGNOSIS — Z800 Family history of malignant neoplasm of digestive organs: Secondary | ICD-10-CM | POA: Insufficient documentation

## 2022-12-10 DIAGNOSIS — Z87891 Personal history of nicotine dependence: Secondary | ICD-10-CM | POA: Insufficient documentation

## 2022-12-10 DIAGNOSIS — Z809 Family history of malignant neoplasm, unspecified: Secondary | ICD-10-CM | POA: Diagnosis not present

## 2022-12-10 DIAGNOSIS — Z79899 Other long term (current) drug therapy: Secondary | ICD-10-CM | POA: Insufficient documentation

## 2022-12-10 DIAGNOSIS — Z885 Allergy status to narcotic agent status: Secondary | ICD-10-CM | POA: Diagnosis not present

## 2022-12-10 DIAGNOSIS — Z833 Family history of diabetes mellitus: Secondary | ICD-10-CM | POA: Insufficient documentation

## 2022-12-10 DIAGNOSIS — C50411 Malignant neoplasm of upper-outer quadrant of right female breast: Secondary | ICD-10-CM | POA: Insufficient documentation

## 2022-12-10 NOTE — Progress Notes (Signed)
[facility] CONSULT NOTE  Patient Care Team: Hellams, Greer Pickerel as PCP - General (Physician Assistant)  CHIEF COMPLAINTS/PURPOSE OF CONSULTATION:  Newly diagnosed breast cancer  HISTORY OF PRESENTING ILLNESS:  Candace Espinoza 52 y.o. female is here because of recent diagnosis of right breast cancer.   I reviewed her records extensively and collaborated the history with the patient.  SUMMARY OF ONCOLOGIC HISTORY: Oncology History  Malignant neoplasm of upper-outer quadrant of right breast in female, estrogen receptor positive (HCC)  09/12/2022 Mammogram   Screening mammogram showed possible mass in the upper outer right breast measuring about a centimeter at 10:00 4 cm from the nipple, sonographic imaging of the right axilla demonstrates normal lymph nodes.  No enlarged or abnormal lymph nodes.   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   12/04/2022 Cancer Staging   Staging form: Breast, AJCC 8th Edition - Pathologic stage from 12/04/2022: Stage IA (pT1c, pN0, cM0, G3, ER+, PR+, HER2-) - Signed by Rachel Moulds, MD on 12/10/2022 Stage prefix: Initial diagnosis Method of lymph node assessment: Axillary lymph node dissection Multigene prognostic tests performed: Oncotype DX Histologic grading system: 3 grade system    Discussed the use of AI scribe software for clinical note transcription with the patient, who gave verbal consent to  proceed.  History of Present Illness    The patient is a premenopausal individual with a recent diagnosis of invasive ductal carcinoma in the right breast at the 10 o'clock position. The tumor was approximately 1 cm at the time of diagnosis and all lymph nodes were clear. The patient underwent surgery performed by Dr. Luisa Hart, went on to [facility] for medical oncology recommendations. The patient has been recommended chemotherapy by a medical oncology team at [facility], but is currently struggling with the decision due to concerns about side effects, particularly neuropathy and hair loss. The patient has a family history of various cancers, including stomach and prostate cancer in their father and colon cancer in their maternal grandfather. The patient has a history of GERD and was previously on vitamin B shots for energy and anemia, but did not find them helpful. The patient is a former smoker and has been on birth control pills and Depo-Provera in the past.  Rest of the pertinent 10 point ROS reviewed and negative MEDICAL HISTORY:  Past Medical History:  Diagnosis Date   Anemia    Vitamin B-12 Deficiency   Anxiety    Esophagitis    GERD (gastroesophageal reflux disease)     SURGICAL HISTORY: Past Surgical History:  Procedure Laterality Date   BREAST BIOPSY Right 09/17/2022   [place] RT BREAST BX W LOC DEV 1ST LESION IMG BX SPEC US GUIDE 09/17/2022 [facility]MO[facility]   BREAST BIOPSY  10/17/2022   MM RT RADIOACTIVE SEED LOC MAMMO GUIDE 10/17/2022 [facility]   BREAST LUMPECTOMY WITH RADIOACTIVE SEED AND SENTINEL LYMPH NODE BIOPSY Right 10/18/2022   Procedure: RIGHT BREAST SEED LUMPECTOMY, RIGHT SENTINEL LYMPH NODE MAPPING;  Surgeon: Harriette Bouillon, MD;  Location: [facility];  Service: General;  Laterality: Right;  PEC BLOCK   DILITATION & CURRETTAGE/HYSTROSCOPY WITH NOVASURE ABLATION N/A 11/24/2014   Procedure: Hysteroscopy DILATATION & CURETTAGE  WITH Failed  NOVASURE ABLATION;   Surgeon: Genia Del, MD;  Location: [facility];  Service: Gynecology;  Laterality: N/A;   UPPER GI ENDOSCOPY     WRIST GANGLION EXCISION     Right    SOCIAL HISTORY: Social History   Socioeconomic History   Marital status: Married    Spouse name: Not on file   Number of children: Not on file   Years of education: Not on file   Highest education level: Not on file  Occupational History   Occupation: financial services    Employer: [employer]  Tobacco Use   Smoking status: Former    Current packs/day: 0.10    Average packs/day: 0.1 packs/day for 20.0 years (2.0 ttl pk-yrs)    Types: Cigarettes   Smokeless tobacco: Never  Vaping Use   Vaping status: Never Used  Substance and Sexual Activity   Alcohol use: Yes   Drug use: No   Sexual activity: Yes    Birth control/protection: Injection  Other Topics Concern   Not on file  Social History Narrative   Not on file   Social Determinants of Health   Financial Resource Strain: Low Risk  (03/24/2022)   Received from [facility]   Overall Financial Resource Strain (CARDIA)    Difficulty of Paying Living Expenses: Not hard at all  Food Insecurity: No Food Insecurity (12/04/2022)   Hunger Vital Sign    Worried About Running Out of Food in the Last Year: Never true    Ran Out of Food in the Last Year: Never true  Transportation Needs: No Transportation Needs (12/04/2022)   PRAPARE - Administrator, Civil Service (Medical): No    Lack of Transportation (Non-Medical): No  Physical Activity: Insufficiently Active (03/24/2022)   Received [facility]   Exercise Vital Sign    Days of Exercise per Week: 2 days    Minutes of Exercise per Session: 30 min  Stress: Stress Concern Present (03/24/2022)   Received from [facility] - Occupational Stress Questionnaire    Feeling of Stress : Very much  Social Connections: Socially Integrated (03/24/2022)   Received  from [facility]   Social Network    How would you rate your social network (family, work, friends)?: Good participation with social networks  Intimate Partner Violence: Not At Risk (12/04/2022)   Humiliation, Afraid, Rape, and Kick questionnaire    Fear of Current or Ex-Partner: No    Emotionally Abused: No    Physically Abused: No    Sexually Abused: No    FAMILY HISTORY: Family History  Problem Relation Age of Onset   Prostate cancer Father    Cancer Maternal Grandmother    Colon cancer Maternal Grandfather    Cancer Paternal Grandmother    Cancer Paternal Grandfather    Diabetes Other        1st degree relative   Hypertension Other     ALLERGIES:  is allergic to codeine and doxycycline.  MEDICATIONS:  Current Outpatient Medications  Medication Sig Dispense Refill   albuterol (VENTOLIN HFA) 108 (90 Base) MCG/ACT inhaler INHALE 2 PUFFS BY MOUTH EVERY 6 HOURS AS NEEDED FOR WHEEZE (Patient not taking: Reported on 12/04/2022)     ALPRAZolam (XANAX) 0.25 MG tablet Take 1 tablet (  0.25 mg total) by mouth 3 (three) times daily as needed for anxiety. 30 tablet 2   Calcium-Magnesium-Vitamin D (CALCIUM MAGNESIUM PO) Take 2 tablets by mouth daily. (Patient not taking: Reported on 12/04/2022)     Cholecalciferol (VITAMIN D PO) Take 1 tablet by mouth daily. (Patient not taking: Reported on 12/04/2022)     Ciclopirox 1 % shampoo Massage into scalp and let sit 3 min then rinse--- use 2x a week x 4 weeks (Patient not taking: Reported on 12/04/2022) 120 mL 1   cyclobenzaprine (FLEXERIL) 10 MG tablet Take 1 tablet (10 mg total) by mouth 3 (three) times daily as needed for muscle spasms. (Patient not taking: Reported on 11/22/2014) 30 tablet 0   meclizine (ANTIVERT) 25 MG tablet Take 1 tablet (25 mg total) by mouth 3 (three) times daily as needed for dizziness. (Patient not taking: Reported on 11/22/2014) 30 tablet 0   oxyCODONE (OXY IR/ROXICODONE) 5 MG immediate release tablet Take 1 tablet (5 mg  total) by mouth every 6 (six) hours as needed for severe pain (pain score 7-10). (Patient not taking: Reported on 12/04/2022) 15 tablet 0   Probiotic Product (PROBIOTIC PO) Take 1 capsule by mouth daily.  (Patient not taking: Reported on 12/04/2022)     vitamin B-12 (CYANOCOBALAMIN) 100 MCG tablet Take 100 mcg by mouth daily. (Patient not taking: Reported on 12/04/2022)     No current facility-administered medications for this visit.    REVIEW OF SYSTEMS:   Constitutional: Denies fevers, chills or abnormal night sweats Eyes: Denies blurriness of vision, double vision or watery eyes Ears, nose, mouth, throat, and face: Denies mucositis or sore throat Respiratory: Denies cough, dyspnea or wheezes Cardiovascular: Denies palpitation, chest discomfort or lower extremity swelling Gastrointestinal:  Denies nausea, heartburn or change in bowel habits Skin: Denies abnormal skin rashes Lymphatics: Denies new lymphadenopathy or easy bruising Neurological:Denies numbness, tingling or new weaknesses Behavioral/Psych: Mood is stable, no new changes  Breast: Denies any palpable lumps or discharge All other systems were reviewed with the patient and are negative.  PHYSICAL EXAMINATION: ECOG PERFORMANCE STATUS: 0 - Asymptomatic  Vitals:   12/10/22 1334  BP: 120/75  Pulse: 92  Resp: 17  Temp: 98.7 F (37.1 C)  SpO2: 97%   Filed Weights   12/10/22 1334  Weight: 131 lb 6.4 oz (59.6 kg)    GENERAL:alert, no distress and comfortable  LABORATORY DATA:  I have reviewed the data as listed Lab Results  Component Value Date   WBC 8.3 11/24/2014   HGB 11.5 (L) 11/24/2014   HCT 34.3 (L) 11/24/2014   MCV 93.7 11/24/2014   PLT 305 11/24/2014   Lab Results  Component Value Date   NA 138 06/23/2010   K 4.7 06/23/2010   CL 99 06/23/2010   CO2 26 06/23/2010    RADIOGRAPHIC STUDIES: I have personally reviewed the radiological reports and agreed with the findings in the report.  ASSESSMENT AND  PLAN:  Malignant neoplasm of upper-outer quadrant of right breast in female, estrogen receptor positive (HCC) This is a very pleasant 52 year old premenopausal female patient with newly diagnosed right breast grade 3 IDC, ER positive PR positive HER2 nonamplified, Ki-67 of 30% status post lumpectomy, Oncotype DX of 27 referred to medical oncology for additional recommendations.  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 HER2 negative. Oncotype DX score of 27, about 16% risk of distant recurrence at 9 years without  chemotherapy.  We do agree with the chemotherapy recommendations with [facility] team, I did recommend docetaxel and cyclophosphamide every 21 days for 4 cycles Discussed the benefits and risks of chemotherapy, radiation, and anti-estrogen therapy. -Recommend chemotherapy with Docetaxel and Cyclophosphamide every 21 days for 4 cycles. -Consider use of DigniCap to minimize hair loss during chemotherapy. -Plan for radiation therapy post-chemotherapy. -Initiate anti-estrogen therapy post-radiation, likely for 10 years, tamoxifen with OFS versus AI with OFS Patient is not quite sure about taking chemotherapy.  She understands the benefits and risks, she would like to think about it.  General Health Maintenance -Consider B12 level check due to patient's interest in B12 shots for energy. -Encourage balanced diet, including moderate consumption of soy products. -Continue smoking cessation. -Plan to follow up with patient next Monday via telephone visit.  She will proceed with adjuvant radiation and antiestrogen therapy if she refuses chemotherapy   All questions were answered. The patient knows to call the clinic with any problems, questions or concerns.    Rachel Moulds, MD 12/10/22

## 2022-12-10 NOTE — Assessment & Plan Note (Signed)
This is a very pleasant 52 year old premenopausal female patient with newly diagnosed right breast grade 3 IDC, ER positive PR positive HER2 nonamplified, Ki-67 of 30% status post lumpectomy, Oncotype DX of 27 referred to medical oncology for additional recommendations.  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 HER2 negative. Oncotype DX score of 27, about 16% risk of distant recurrence at 9 years without chemotherapy.  We do agree with the chemotherapy recommendations with [facility] team, I did recommend docetaxel and cyclophosphamide every 21 days for 4 cycles Discussed the benefits and risks of chemotherapy, radiation, and anti-estrogen therapy. -Recommend chemotherapy with Docetaxel and Cyclophosphamide every 21 days for 4 cycles. -Consider use of DigniCap to minimize hair loss during chemotherapy. -Plan for radiation therapy post-chemotherapy. -Initiate anti-estrogen therapy post-radiation, likely for 10 years, tamoxifen with OFS versus AI with OFS Patient is not quite sure about taking chemotherapy.  She understands the benefits and risks, she would like to think about it.  General Health Maintenance -Consider B12 level check due to patient's interest in B12 shots for energy. -Encourage balanced diet, including moderate consumption of soy products. -Continue smoking cessation. -Plan to follow up with patient next Monday via telephone visit.  She will proceed with adjuvant radiation and antiestrogen therapy if she refuses chemotherapy

## 2022-12-17 ENCOUNTER — Inpatient Hospital Stay: Payer: 59 | Admitting: Hematology and Oncology

## 2022-12-17 ENCOUNTER — Ambulatory Visit
Admission: RE | Admit: 2022-12-17 | Discharge: 2022-12-17 | Disposition: A | Discharge status: Home / Self Care | Payer: 59 | Source: Ambulatory Visit | Attending: Radiation Oncology | Admitting: Radiation Oncology

## 2022-12-17 ENCOUNTER — Other Ambulatory Visit: Payer: Self-pay

## 2022-12-17 DIAGNOSIS — C50411 Malignant neoplasm of upper-outer quadrant of right female breast: Secondary | ICD-10-CM | POA: Insufficient documentation

## 2022-12-17 DIAGNOSIS — Z8249 Family history of ischemic heart disease and other diseases of the circulatory system: Secondary | ICD-10-CM | POA: Insufficient documentation

## 2022-12-17 DIAGNOSIS — Z833 Family history of diabetes mellitus: Secondary | ICD-10-CM | POA: Insufficient documentation

## 2022-12-17 DIAGNOSIS — Z885 Allergy status to narcotic agent status: Secondary | ICD-10-CM | POA: Insufficient documentation

## 2022-12-17 DIAGNOSIS — Z809 Family history of malignant neoplasm, unspecified: Secondary | ICD-10-CM | POA: Insufficient documentation

## 2022-12-17 DIAGNOSIS — Z170 Estrogen receptor positive status [ER+]: Secondary | ICD-10-CM | POA: Insufficient documentation

## 2022-12-17 DIAGNOSIS — Z79899 Other long term (current) drug therapy: Secondary | ICD-10-CM | POA: Insufficient documentation

## 2022-12-17 DIAGNOSIS — Z87891 Personal history of nicotine dependence: Secondary | ICD-10-CM | POA: Insufficient documentation

## 2022-12-17 DIAGNOSIS — Z1721 Progesterone receptor positive status: Secondary | ICD-10-CM | POA: Insufficient documentation

## 2022-12-17 DIAGNOSIS — Z8042 Family history of malignant neoplasm of prostate: Secondary | ICD-10-CM | POA: Insufficient documentation

## 2022-12-17 DIAGNOSIS — Z881 Allergy status to other antibiotic agents status: Secondary | ICD-10-CM | POA: Insufficient documentation

## 2022-12-17 DIAGNOSIS — Z800 Family history of malignant neoplasm of digestive organs: Secondary | ICD-10-CM | POA: Insufficient documentation

## 2022-12-17 DIAGNOSIS — K2100 Gastro-esophageal reflux disease with esophagitis, without bleeding: Secondary | ICD-10-CM | POA: Insufficient documentation

## 2022-12-17 NOTE — Progress Notes (Signed)
Appt cancelled

## 2022-12-18 ENCOUNTER — Encounter: Payer: Self-pay | Admitting: *Deleted

## 2022-12-18 ENCOUNTER — Inpatient Hospital Stay: Payer: 59 | Admitting: Licensed Clinical Social Worker

## 2022-12-18 DIAGNOSIS — Z170 Estrogen receptor positive status [ER+]: Secondary | ICD-10-CM

## 2022-12-18 NOTE — Progress Notes (Signed)
[facility] Clinical Social Work  Initial Assessment   Candace Espinoza is a 52 y.o. year old female contacted by phone. Clinical Social Work was referred by new patient protocol for assessment of psychosocial needs.   SDOH (Social Determinants of Health) assessments performed: Yes SDOH Interventions    Flowsheet Row CONSULT from 12/04/2022 in [facility]  SDOH Interventions   Food Insecurity Interventions Intervention Not Indicated  Housing Interventions Intervention Not Indicated  Transportation Interventions Intervention Not Indicated  Utilities Interventions Intervention Not Indicated       SDOH Screenings   Food Insecurity: No Food Insecurity (12/04/2022)  Housing: Low Risk  (12/04/2022)  Transportation Needs: No Transportation Needs (12/04/2022)  Utilities: Not At Risk (12/04/2022)  Depression (PHQ2-9): Medium Risk (12/04/2022)  Financial Resource Strain: Low Risk  (03/24/2022)   Received from [facility]  Physical Activity: Insufficiently Active (03/24/2022)   Received from [facility]  Social Connections: Socially Integrated (03/24/2022)   Received from [facility]  Stress: Stress Concern Present (03/24/2022)   Received from [facility]  Tobacco Use: Medium Risk (12/04/2022)     Distress Screen completed: No     No data to display            Family/Social Information:  Housing Arrangement: patient lives with husband Family members/support persons in your life? husband Transportation concerns: no  Employment: Working full time. She manages a few people  Income source: Employment Financial concerns: No Type of concern: None Food access concerns: no Religious or spiritual practice: Not known Services Currently in place:  [facility]  Coping/ Adjustment to diagnosis: Patient understands treatment plan and what happens next? Has had surgery. Preparing for radiation. Chose to forego chemo after weighing risks and benefits. Feels judged by  many people in her life because of this. She is having trouble processing everything and often feels like she is not talking about herself when it is discussed. Feels like she has to put on a strong front Concerns about diagnosis and/or treatment: Overwhelmed by information Patient reported stressors: Adjusting to my illness Current coping skills/ strengths: Capable of independent living , Communication skills , and Other: supportive spouse    SUMMARY: Current SDOH Barriers:  Emotional adjustment to cancer diagnosis  Clinical Social Work Clinical Goal(s):  Patient will work with SW to address concerns related to adjustment to illness  Interventions: Discussed common feeling and emotions when being diagnosed with cancer, and the importance of support during treatment Informed patient of the support team roles and support services at [facility] Provided CSW contact information and encouraged patient to call with any questions or concerns   Follow Up Plan: CSW will see patient on 01/03/23 for counseling Patient verbalizes understanding of plan: Yes    [name] E [name], LCSW Clinical Social Worker [facility]

## 2022-12-24 ENCOUNTER — Ambulatory Visit: Payer: 59 | Admitting: Radiation Oncology

## 2022-12-25 ENCOUNTER — Ambulatory Visit: Payer: 59

## 2022-12-27 ENCOUNTER — Ambulatory Visit: Payer: 59

## 2022-12-28 ENCOUNTER — Ambulatory Visit: Payer: 59

## 2022-12-28 DIAGNOSIS — C50411 Malignant neoplasm of upper-outer quadrant of right female breast: Secondary | ICD-10-CM | POA: Diagnosis not present

## 2022-12-31 ENCOUNTER — Ambulatory Visit: Payer: 59 | Admitting: Radiation Oncology

## 2022-12-31 ENCOUNTER — Other Ambulatory Visit: Payer: Self-pay

## 2022-12-31 ENCOUNTER — Ambulatory Visit
Admission: RE | Admit: 2022-12-31 | Discharge: 2022-12-31 | Disposition: A | Discharge status: Home / Self Care | Payer: 59 | Source: Ambulatory Visit | Attending: Radiation Oncology | Admitting: Radiation Oncology

## 2022-12-31 DIAGNOSIS — C50411 Malignant neoplasm of upper-outer quadrant of right female breast: Secondary | ICD-10-CM | POA: Diagnosis not present

## 2022-12-31 LAB — RAD ONC ARIA SESSION SUMMARY
Course Elapsed Days: 0
Plan Fractions Treated to Date: 1
Plan Prescribed Dose Per Fraction: 2.67 Gy
Plan Total Fractions Prescribed: 15
Plan Total Prescribed Dose: 40.05 Gy
Reference Point Dosage Given to Date: 2.67 Gy
Reference Point Session Dosage Given: 2.67 Gy
Session Number: 1

## 2023-01-01 ENCOUNTER — Ambulatory Visit: Payer: 59

## 2023-01-01 ENCOUNTER — Ambulatory Visit
Admission: RE | Admit: 2023-01-01 | Discharge: 2023-01-01 | Disposition: A | Discharge status: Home / Self Care | Payer: 59 | Source: Ambulatory Visit | Attending: Radiation Oncology

## 2023-01-01 ENCOUNTER — Other Ambulatory Visit: Payer: Self-pay

## 2023-01-01 DIAGNOSIS — C50411 Malignant neoplasm of upper-outer quadrant of right female breast: Secondary | ICD-10-CM | POA: Diagnosis not present

## 2023-01-01 LAB — RAD ONC ARIA SESSION SUMMARY
Course Elapsed Days: 1
Plan Fractions Treated to Date: 2
Plan Prescribed Dose Per Fraction: 2.67 Gy
Plan Total Fractions Prescribed: 15
Plan Total Prescribed Dose: 40.05 Gy
Reference Point Dosage Given to Date: 5.34 Gy
Reference Point Session Dosage Given: 2.67 Gy
Session Number: 2

## 2023-01-03 ENCOUNTER — Ambulatory Visit
Admission: RE | Admit: 2023-01-03 | Discharge: 2023-01-03 | Disposition: A | Discharge status: Home / Self Care | Payer: 59 | Source: Ambulatory Visit | Attending: Radiation Oncology | Admitting: Radiation Oncology

## 2023-01-03 ENCOUNTER — Other Ambulatory Visit: Payer: Self-pay

## 2023-01-03 ENCOUNTER — Inpatient Hospital Stay: Payer: 59 | Attending: Hematology and Oncology | Admitting: Licensed Clinical Social Worker

## 2023-01-03 ENCOUNTER — Ambulatory Visit: Payer: 59

## 2023-01-03 DIAGNOSIS — Z79899 Other long term (current) drug therapy: Secondary | ICD-10-CM | POA: Diagnosis not present

## 2023-01-03 DIAGNOSIS — F419 Anxiety disorder, unspecified: Secondary | ICD-10-CM | POA: Diagnosis not present

## 2023-01-03 DIAGNOSIS — Z833 Family history of diabetes mellitus: Secondary | ICD-10-CM | POA: Diagnosis not present

## 2023-01-03 DIAGNOSIS — Z8042 Family history of malignant neoplasm of prostate: Secondary | ICD-10-CM | POA: Diagnosis not present

## 2023-01-03 DIAGNOSIS — Z881 Allergy status to other antibiotic agents status: Secondary | ICD-10-CM | POA: Insufficient documentation

## 2023-01-03 DIAGNOSIS — Z87891 Personal history of nicotine dependence: Secondary | ICD-10-CM | POA: Insufficient documentation

## 2023-01-03 DIAGNOSIS — R5383 Other fatigue: Secondary | ICD-10-CM | POA: Insufficient documentation

## 2023-01-03 DIAGNOSIS — Z801 Family history of malignant neoplasm of trachea, bronchus and lung: Secondary | ICD-10-CM | POA: Diagnosis not present

## 2023-01-03 DIAGNOSIS — Z170 Estrogen receptor positive status [ER+]: Secondary | ICD-10-CM | POA: Insufficient documentation

## 2023-01-03 DIAGNOSIS — Z5111 Encounter for antineoplastic chemotherapy: Secondary | ICD-10-CM | POA: Insufficient documentation

## 2023-01-03 DIAGNOSIS — R635 Abnormal weight gain: Secondary | ICD-10-CM | POA: Diagnosis not present

## 2023-01-03 DIAGNOSIS — Z8249 Family history of ischemic heart disease and other diseases of the circulatory system: Secondary | ICD-10-CM | POA: Insufficient documentation

## 2023-01-03 DIAGNOSIS — Z800 Family history of malignant neoplasm of digestive organs: Secondary | ICD-10-CM | POA: Diagnosis not present

## 2023-01-03 DIAGNOSIS — C50411 Malignant neoplasm of upper-outer quadrant of right female breast: Secondary | ICD-10-CM | POA: Insufficient documentation

## 2023-01-03 DIAGNOSIS — Z885 Allergy status to narcotic agent status: Secondary | ICD-10-CM | POA: Diagnosis not present

## 2023-01-03 LAB — RAD ONC ARIA SESSION SUMMARY
Course Elapsed Days: 3
Plan Fractions Treated to Date: 3
Plan Prescribed Dose Per Fraction: 2.67 Gy
Plan Total Fractions Prescribed: 15
Plan Total Prescribed Dose: 40.05 Gy
Reference Point Dosage Given to Date: 8.01 Gy
Reference Point Session Dosage Given: 2.67 Gy
Session Number: 3

## 2023-01-03 NOTE — Progress Notes (Signed)
 [facility] CSW Counseling Note  Patient was referred by new patient protocol. Treatment type: Individual  Presenting Concerns: Patient and/or family reports the following symptoms/concerns: stress and adjustment to cancer diagnosis Duration of problem: 3 months; Severity of problem: moderate   Orientation:oriented to person, place, time/date, and situation.   Affect: Appropriate, Congruent, and Tearful Risk of harm to self or others: No plan to harm self or others  Patient and/or Family's Strengths/Protective Factors: Concrete supports in place (healthy food, safe environments, etc.)Capable of independent living  Communication skills  Supportive family/friends      Goals Addressed: Patient will:  Increase knowledge and/or ability of: coping skills  Increase healthy adjustment to current life circumstances   Progress towards Goals: Initial   Interventions: Interventions utilized:  Strength-based and Supportive      Assessment: Patient currently experiencing ongoing adjustment and processing regarding cancer diagnosis and treatment decisions. Patient shared her story since diagnosis today and some of her feelings around it. She has anger and questioning why her. 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    Pt is fairly independent and is not sharing the information with too many people. She has escape/release watching reality tv, camping, and boating. She does have strong support from her husband.  CSW supported pt's sharing and processing today. At end of session, briefly discussed additional tools that can be explored more in future sessions if desired, such as breathing, physical release, and grounding skills.   Plan: Follow up with CSW: 1 week Behavioral recommendations: continue setting boundaries with others who are asking too much about how you are  doing. Redirect conversation to another topic when needed Referral(s): n/a       Candace Mahan E Candace Hollenbach, LCSW

## 2023-01-04 ENCOUNTER — Ambulatory Visit
Admission: RE | Admit: 2023-01-04 | Discharge: 2023-01-04 | Disposition: A | Discharge status: Home / Self Care | Payer: 59 | Source: Ambulatory Visit | Attending: Radiation Oncology

## 2023-01-04 ENCOUNTER — Ambulatory Visit: Payer: 59

## 2023-01-04 ENCOUNTER — Other Ambulatory Visit: Payer: Self-pay

## 2023-01-04 DIAGNOSIS — Z5111 Encounter for antineoplastic chemotherapy: Secondary | ICD-10-CM | POA: Diagnosis not present

## 2023-01-04 LAB — RAD ONC ARIA SESSION SUMMARY
Course Elapsed Days: 4
Plan Fractions Treated to Date: 4
Plan Prescribed Dose Per Fraction: 2.67 Gy
Plan Total Fractions Prescribed: 15
Plan Total Prescribed Dose: 40.05 Gy
Reference Point Dosage Given to Date: 10.68 Gy
Reference Point Session Dosage Given: 2.67 Gy
Session Number: 4

## 2023-01-07 ENCOUNTER — Ambulatory Visit
Admission: RE | Admit: 2023-01-07 | Discharge: 2023-01-07 | Disposition: A | Discharge status: Home / Self Care | Payer: 59 | Source: Ambulatory Visit | Attending: Radiation Oncology | Admitting: Radiation Oncology

## 2023-01-07 ENCOUNTER — Other Ambulatory Visit: Payer: Self-pay

## 2023-01-07 DIAGNOSIS — Z170 Estrogen receptor positive status [ER+]: Secondary | ICD-10-CM

## 2023-01-07 DIAGNOSIS — Z5111 Encounter for antineoplastic chemotherapy: Secondary | ICD-10-CM | POA: Diagnosis not present

## 2023-01-07 LAB — RAD ONC ARIA SESSION SUMMARY
Course Elapsed Days: 7
Plan Fractions Treated to Date: 5
Plan Prescribed Dose Per Fraction: 2.67 Gy
Plan Total Fractions Prescribed: 15
Plan Total Prescribed Dose: 40.05 Gy
Reference Point Dosage Given to Date: 13.35 Gy
Reference Point Session Dosage Given: 2.67 Gy
Session Number: 5

## 2023-01-07 MED ORDER — ALRA NON-METALLIC DEODORANT (RAD-ONC)
1.0000 | Freq: Once | TOPICAL | Status: AC
  Administered 2023-01-07: 1 via TOPICAL

## 2023-01-07 MED ORDER — RADIAPLEXRX EX GEL: Freq: Once | CUTANEOUS | Status: AC

## 2023-01-08 ENCOUNTER — Other Ambulatory Visit: Payer: Self-pay

## 2023-01-08 ENCOUNTER — Ambulatory Visit
Admission: RE | Admit: 2023-01-08 | Discharge: 2023-01-08 | Disposition: A | Discharge status: Home / Self Care | Payer: 59 | Source: Ambulatory Visit | Attending: Radiation Oncology

## 2023-01-08 DIAGNOSIS — Z5111 Encounter for antineoplastic chemotherapy: Secondary | ICD-10-CM | POA: Diagnosis not present

## 2023-01-08 LAB — RAD ONC ARIA SESSION SUMMARY
Course Elapsed Days: 8
Plan Fractions Treated to Date: 6
Plan Prescribed Dose Per Fraction: 2.67 Gy
Plan Total Fractions Prescribed: 15
Plan Total Prescribed Dose: 40.05 Gy
Reference Point Dosage Given to Date: 16.02 Gy
Reference Point Session Dosage Given: 2.67 Gy
Session Number: 6

## 2023-01-09 ENCOUNTER — Other Ambulatory Visit: Payer: Self-pay

## 2023-01-09 ENCOUNTER — Ambulatory Visit
Admission: RE | Admit: 2023-01-09 | Discharge: 2023-01-09 | Disposition: A | Discharge status: Home / Self Care | Payer: 59 | Source: Ambulatory Visit | Attending: Radiation Oncology | Admitting: Radiation Oncology

## 2023-01-09 DIAGNOSIS — Z5111 Encounter for antineoplastic chemotherapy: Secondary | ICD-10-CM | POA: Diagnosis not present

## 2023-01-09 LAB — RAD ONC ARIA SESSION SUMMARY
Course Elapsed Days: 9
Plan Fractions Treated to Date: 7
Plan Prescribed Dose Per Fraction: 2.67 Gy
Plan Total Fractions Prescribed: 15
Plan Total Prescribed Dose: 40.05 Gy
Reference Point Dosage Given to Date: 18.69 Gy
Reference Point Session Dosage Given: 2.67 Gy
Session Number: 7

## 2023-01-10 ENCOUNTER — Ambulatory Visit
Admission: RE | Admit: 2023-01-10 | Discharge: 2023-01-10 | Disposition: A | Discharge status: Home / Self Care | Payer: 59 | Source: Ambulatory Visit | Attending: Radiation Oncology | Admitting: Radiation Oncology

## 2023-01-10 ENCOUNTER — Other Ambulatory Visit: Payer: Self-pay

## 2023-01-10 ENCOUNTER — Inpatient Hospital Stay: Payer: 59 | Admitting: Licensed Clinical Social Worker

## 2023-01-10 DIAGNOSIS — Z5111 Encounter for antineoplastic chemotherapy: Secondary | ICD-10-CM | POA: Diagnosis not present

## 2023-01-10 LAB — RAD ONC ARIA SESSION SUMMARY
Course Elapsed Days: 10
Plan Fractions Treated to Date: 8
Plan Prescribed Dose Per Fraction: 2.67 Gy
Plan Total Fractions Prescribed: 15
Plan Total Prescribed Dose: 40.05 Gy
Reference Point Dosage Given to Date: 21.36 Gy
Reference Point Session Dosage Given: 2.67 Gy
Session Number: 8

## 2023-01-10 NOTE — Progress Notes (Signed)
 [facility] CSW Counseling Note  Patient was referred by new patient protocol. Treatment type: Individual  Presenting Concerns: Patient and/or family reports the following symptoms/concerns: stress and adjustment to cancer diagnosis Duration of problem: 3 months; Severity of problem: moderate   Orientation:oriented to person, place, time/date, and situation.   Affect: Appropriate and Congruent Risk of harm to self or others: No plan to harm self or others  Patient and/or Family's Strengths/Protective Factors: Concrete supports in place (healthy food, safe environments, etc.)Capable of independent living  Communication skills  Supportive family/friends      Goals Addressed: Patient will:  Increase knowledge and/or ability of: coping skills  Increase healthy adjustment to current life circumstances   Progress towards Goals: Progressing   Interventions: Interventions utilized:  CBT and Meditation: deep breathing       Assessment: Patient continued to process cancer experience. Utilized CBT techniques to discuss noticing thoughts, acknowledging them instead of fighting it and then letting them drift past. Practiced five finger breathing technique. Discussed how to utilize during higher anxiety times (body pain, mammogram) as well as prior to frustrating work situations.     Plan: Follow up with CSW: 2 weeks Behavioral recommendations: continue setting boundaries with others who are asking too much about how you are doing. Redirect conversation to another topic when needed. Practice five finger breathing when anxiety is increasing or prior to a situation that you know will be stressful or irritating for you Referral(s): n/a       Tahnee Cifuentes E Alexsandria Kivett, LCSW

## 2023-01-11 ENCOUNTER — Ambulatory Visit
Admission: RE | Admit: 2023-01-11 | Discharge: 2023-01-11 | Disposition: A | Discharge status: Home / Self Care | Payer: 59 | Source: Ambulatory Visit | Attending: Radiation Oncology

## 2023-01-11 ENCOUNTER — Other Ambulatory Visit: Payer: Self-pay

## 2023-01-11 DIAGNOSIS — Z5111 Encounter for antineoplastic chemotherapy: Secondary | ICD-10-CM | POA: Diagnosis not present

## 2023-01-11 LAB — RAD ONC ARIA SESSION SUMMARY
Course Elapsed Days: 11
Plan Fractions Treated to Date: 9
Plan Prescribed Dose Per Fraction: 2.67 Gy
Plan Total Fractions Prescribed: 15
Plan Total Prescribed Dose: 40.05 Gy
Reference Point Dosage Given to Date: 24.03 Gy
Reference Point Session Dosage Given: 2.67 Gy
Session Number: 9

## 2023-01-14 ENCOUNTER — Ambulatory Visit
Admission: RE | Admit: 2023-01-14 | Discharge: 2023-01-14 | Disposition: A | Discharge status: Home / Self Care | Payer: 59 | Source: Ambulatory Visit | Attending: Radiation Oncology | Admitting: Radiation Oncology

## 2023-01-14 ENCOUNTER — Other Ambulatory Visit: Payer: Self-pay

## 2023-01-14 ENCOUNTER — Ambulatory Visit: Payer: 59

## 2023-01-14 ENCOUNTER — Ambulatory Visit
Admission: RE | Admit: 2023-01-14 | Discharge: 2023-01-14 | Disposition: A | Discharge status: Home / Self Care | Payer: 59 | Source: Ambulatory Visit | Attending: Radiation Oncology

## 2023-01-14 DIAGNOSIS — Z5111 Encounter for antineoplastic chemotherapy: Secondary | ICD-10-CM | POA: Diagnosis not present

## 2023-01-14 LAB — RAD ONC ARIA SESSION SUMMARY
Course Elapsed Days: 14
Plan Fractions Treated to Date: 10
Plan Prescribed Dose Per Fraction: 2.67 Gy
Plan Total Fractions Prescribed: 15
Plan Total Prescribed Dose: 40.05 Gy
Reference Point Dosage Given to Date: 26.7 Gy
Reference Point Session Dosage Given: 2.67 Gy
Session Number: 10

## 2023-01-14 NOTE — Progress Notes (Signed)
 REFERRING PROVIDER: Loretha Ash, MD [address] [place],  [place] [address]  PRIMARY PROVIDER:  Nemiah Abelson, [place]  PRIMARY REASON FOR VISIT:  Encounter Diagnoses  Name Primary?   Malignant neoplasm of upper-outer quadrant of right breast in female, estrogen receptor positive (HCC) Yes   Family history of prostate cancer    I connected with Ms. Badertscher on 01/15/2023 at 10:15am ET by MyChart video conference and verified that I am speaking with the correct person using two identifiers.   Patient location: Home Provider location: [facility] office  HISTORY OF PRESENT ILLNESS:   Ms. Boyington, a 53 y.o. female, was seen for a [place] cancer genetics consultation at the request of Dr. Loretha due to a personal history of breast cancer.  Ms. Etcheverry presents to clinic today to discuss the possibility of a hereditary predisposition to cancer, to discuss genetic testing, and to further clarify her future cancer risks, as well as potential cancer risks for family members.   In September 2024, at the age of 71, Ms. 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    CANCER HISTORY:  Oncology History  Malignant neoplasm of upper-outer quadrant of right breast in female, estrogen receptor positive (HCC)  09/12/2022 Mammogram   Screening mammogram showed possible mass in the upper outer right breast measuring about a centimeter at 10:00 4 cm from the nipple, sonographic imaging of the right axilla demonstrates normal lymph nodes.  No enlarged or abnormal lymph nodes.   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   12/04/2022 Cancer Staging   Staging form: Breast, AJCC 8th Edition - Pathologic stage from 12/04/2022: Stage IA (pT1c, pN0, cM0, G3, ER+, PR+, HER2-) - Signed by Loretha Ash, MD on 12/10/2022 Stage prefix: Initial diagnosis Method of lymph node assessment: Axillary lymph node dissection Multigene prognostic tests performed: Oncotype DX Histologic grading system: 3 grade system      RISK FACTORS:  Colonoscopy: yes;  most recent in 2022; less than 5 lifetime polyps .  Hysterectomy: no.  Ovaries intact: yes.  Dermatology screening: yes-- as needed.   Past Medical History:  Diagnosis Date   Anemia    Vitamin B-12 Deficiency   Anxiety    Esophagitis    GERD (gastroesophageal reflux disease)     Past Surgical History:  Procedure Laterality Date   BREAST BIOPSY Right 09/17/2022   US  RT BREAST BX W LOC DEV 1ST LESION IMG BX SPEC US  GUIDE 09/17/2022 [facility]MO[facility]   BREAST BIOPSY  10/17/2022   MM RT RADIOACTIVE SEED LOC MAMMO GUIDE 10/17/2022 [facility]AMMO[facility]   BREAST LUMPECTOMY WITH RADIOACTIVE SEED AND SENTINEL LYMPH NODE BIOPSY Right 10/18/2022   Procedure: RIGHT BREAST SEED LUMPECTOMY, RIGHT SENTINEL LYMPH NODE MAPPING;  Surgeon: Vanderbilt Ned, MD;  Location: [facility];  Service: General;  Laterality: Right;  PEC BLOCK   DILITATION & CURRETTAGE/HYSTROSCOPY WITH NOVASURE ABLATION N/A 11/24/2014   Procedure: Hysteroscopy DILATATION & CURETTAGE  WITH Failed  NOVASURE ABLATION;  Surgeon:  Marie-Lyne Lavoie, MD;  Location: [facility];  Service: Gynecology;  Laterality: N/A;   UPPER GI ENDOSCOPY     WRIST GANGLION EXCISION     Right    FAMILY HISTORY:  We obtained a detailed, 4-generation family history.  Significant diagnoses are listed below: Family History  Problem Relation  Age of Onset   Prostate cancer Father 16   Stomach cancer Father 21   Lung cancer Maternal Grandfather        dx >50   Lung cancer Paternal Grandmother        dx > 50; smoking hx   Prostate cancer Paternal Grandfather        dx 30s     Ms. Gad is unaware of previous family history of genetic testing for hereditary cancer risks. There is no reported Ashkenazi Jewish ancestry. There is no known consanguinity.  GENETIC COUNSELING ASSESSMENT: Ms. Padgett is a 53 y.o. female with a personal and family history which is somewhat suggestive of a hereditary cancer syndrome and predisposition to cancer given the presence of breast and prostate cancer in multiple generations of the family. We, therefore, discussed and recommended the following at today's visit.   DISCUSSION: We discussed that 5 - 10% of cancer is hereditary.  Most cases of hereditary breast cancer are associated with mutations in BRCA1/2.  There are other genes that can be associated with hereditary breast cancer syndromes.  These include but are not limited to CHEK2, ATM, and PALB2.  We discussed that testing is beneficial for several reasons including knowing how to follow individuals for their cancer risks and understanding if other family members could be at risk for cancer and allowing them to undergo genetic testing.   We reviewed the characteristics, features and inheritance patterns of hereditary cancer syndromes. We also discussed genetic testing, including the appropriate family members to test, the process of testing, insurance coverage and turn-around-time for results. We discussed the implications of a negative, positive, and/or variant of uncertain significant result. We recommended Ms. Janet pursue genetic testing for a panel that includes genes associated with breast cancer, prostate cancer, gastric cancer, and other cancers.   Based on Ms. Popescu's personal history of breast cancer along with prostate cancer in two close  relatives on the same side of the family, she meets NCCN criteria for genetic testing.   PLAN: Ms. Viramontes did not wish to pursue genetic testing at today's visit. She wants to think more about how she would handle learning if she was at high risk for certain cancers.  She stated that she may elect to proceed with genetic testing in the future. We understand this decision, and our contact information was provided in case she wishes to proceed with testing in the future or has questions.   We, therefore, recommend Ms. Weisel continue to follow the cancer screening guidelines given by her oncology and primary healthcare providers. Relatives should discuss the family history of cancer with their providers.   Ms. Balash questions were answered to her satisfaction today. Thank you for the referral and allowing us  to share in the care of your patient.   Nelton Amsden M. Nydia, MS, [facility] Genetic Counselor [email or web address] (P) [phone]   45 minutes were spent on the date of the encounter in service to the patient including preparation, face-to-face consultation, documentation and care coordination.  The patient was seen alone by MyChart video visit.  Drs. Iruku, Gudena and/or Lanny were available to discuss this case as needed.  _______________________________________________________________________ For Office Staff:  Number of people involved in session: 1 Was an Intern/ student involved with case: no

## 2023-01-15 ENCOUNTER — Other Ambulatory Visit: Payer: Self-pay

## 2023-01-15 ENCOUNTER — Ambulatory Visit
Admission: RE | Admit: 2023-01-15 | Discharge: 2023-01-15 | Disposition: A | Discharge status: Home / Self Care | Payer: 59 | Source: Ambulatory Visit | Attending: Radiation Oncology | Admitting: Radiation Oncology

## 2023-01-15 ENCOUNTER — Encounter: Payer: Self-pay | Admitting: Genetic Counselor

## 2023-01-15 ENCOUNTER — Inpatient Hospital Stay: Payer: 59

## 2023-01-15 ENCOUNTER — Inpatient Hospital Stay: Payer: 59 | Admitting: Genetic Counselor

## 2023-01-15 DIAGNOSIS — Z170 Estrogen receptor positive status [ER+]: Secondary | ICD-10-CM

## 2023-01-15 DIAGNOSIS — C50411 Malignant neoplasm of upper-outer quadrant of right female breast: Secondary | ICD-10-CM

## 2023-01-15 DIAGNOSIS — Z8042 Family history of malignant neoplasm of prostate: Secondary | ICD-10-CM

## 2023-01-15 DIAGNOSIS — Z5111 Encounter for antineoplastic chemotherapy: Secondary | ICD-10-CM | POA: Diagnosis not present

## 2023-01-15 LAB — RAD ONC ARIA SESSION SUMMARY
Course Elapsed Days: 15
Plan Fractions Treated to Date: 11
Plan Prescribed Dose Per Fraction: 2.67 Gy
Plan Total Fractions Prescribed: 15
Plan Total Prescribed Dose: 40.05 Gy
Reference Point Dosage Given to Date: 29.37 Gy
Reference Point Session Dosage Given: 2.67 Gy
Session Number: 11

## 2023-01-16 ENCOUNTER — Ambulatory Visit
Admission: RE | Admit: 2023-01-16 | Discharge: 2023-01-16 | Disposition: A | Discharge status: Home / Self Care | Payer: 59 | Source: Ambulatory Visit | Attending: Radiation Oncology | Admitting: Radiation Oncology

## 2023-01-16 ENCOUNTER — Inpatient Hospital Stay: Payer: 59

## 2023-01-16 ENCOUNTER — Other Ambulatory Visit: Payer: Self-pay

## 2023-01-16 DIAGNOSIS — Z5111 Encounter for antineoplastic chemotherapy: Secondary | ICD-10-CM | POA: Diagnosis not present

## 2023-01-16 LAB — RAD ONC ARIA SESSION SUMMARY
Course Elapsed Days: 16
Plan Fractions Treated to Date: 12
Plan Prescribed Dose Per Fraction: 2.67 Gy
Plan Total Fractions Prescribed: 15
Plan Total Prescribed Dose: 40.05 Gy
Reference Point Dosage Given to Date: 32.04 Gy
Reference Point Session Dosage Given: 2.67 Gy
Session Number: 12

## 2023-01-17 ENCOUNTER — Other Ambulatory Visit: Payer: Self-pay

## 2023-01-17 ENCOUNTER — Ambulatory Visit
Admission: RE | Admit: 2023-01-17 | Discharge: 2023-01-17 | Disposition: A | Discharge status: Home / Self Care | Payer: 59 | Source: Ambulatory Visit | Attending: Radiation Oncology | Admitting: Radiation Oncology

## 2023-01-17 ENCOUNTER — Ambulatory Visit: Payer: 59 | Admitting: Radiation Oncology

## 2023-01-17 DIAGNOSIS — Z5111 Encounter for antineoplastic chemotherapy: Secondary | ICD-10-CM | POA: Diagnosis not present

## 2023-01-17 LAB — RAD ONC ARIA SESSION SUMMARY
Course Elapsed Days: 17
Plan Fractions Treated to Date: 13
Plan Prescribed Dose Per Fraction: 2.67 Gy
Plan Total Fractions Prescribed: 15
Plan Total Prescribed Dose: 40.05 Gy
Reference Point Dosage Given to Date: 34.71 Gy
Reference Point Session Dosage Given: 2.67 Gy
Session Number: 13

## 2023-01-18 ENCOUNTER — Other Ambulatory Visit: Payer: Self-pay

## 2023-01-18 ENCOUNTER — Ambulatory Visit
Admission: RE | Admit: 2023-01-18 | Discharge: 2023-01-18 | Disposition: A | Discharge status: Home / Self Care | Payer: 59 | Source: Ambulatory Visit | Attending: Radiation Oncology

## 2023-01-18 ENCOUNTER — Inpatient Hospital Stay (HOSPITAL_BASED_OUTPATIENT_CLINIC_OR_DEPARTMENT_OTHER): Payer: 59 | Admitting: Hematology and Oncology

## 2023-01-18 ENCOUNTER — Encounter: Payer: Self-pay | Admitting: Hematology and Oncology

## 2023-01-18 ENCOUNTER — Ambulatory Visit
Admission: RE | Admit: 2023-01-18 | Discharge: 2023-01-18 | Disposition: A | Discharge status: Home / Self Care | Payer: 59 | Source: Ambulatory Visit | Attending: Radiation Oncology | Admitting: Radiation Oncology

## 2023-01-18 VITALS — BP 116/67 | HR 79 | Temp 97.5°F | Resp 17 | Wt 132.6 lb

## 2023-01-18 DIAGNOSIS — C50411 Malignant neoplasm of upper-outer quadrant of right female breast: Secondary | ICD-10-CM

## 2023-01-18 DIAGNOSIS — Z170 Estrogen receptor positive status [ER+]: Secondary | ICD-10-CM | POA: Diagnosis not present

## 2023-01-18 DIAGNOSIS — Z5111 Encounter for antineoplastic chemotherapy: Secondary | ICD-10-CM | POA: Diagnosis not present

## 2023-01-18 LAB — RAD ONC ARIA SESSION SUMMARY
Course Elapsed Days: 18
Plan Fractions Treated to Date: 14
Plan Prescribed Dose Per Fraction: 2.67 Gy
Plan Total Fractions Prescribed: 15
Plan Total Prescribed Dose: 40.05 Gy
Reference Point Dosage Given to Date: 37.38 Gy
Reference Point Session Dosage Given: 2.67 Gy
Session Number: 14

## 2023-01-18 MED ORDER — TAMOXIFEN CITRATE 20 MG PO TABS: 20.0000 mg | ORAL_TABLET | Freq: Every day | ORAL | 3 refills | Status: DC

## 2023-01-18 NOTE — Progress Notes (Signed)
[facility] CONSULT NOTE  Patient Care Team: Hellams, Greer Pickerel as PCP - General (Physician Assistant) Pershing Proud, RN as Oncology Nurse Navigator Donnelly Angelica, RN as Oncology Nurse Navigator Rachel Moulds, MD as Consulting Physician (Hematology and Oncology)  CHIEF COMPLAINTS/PURPOSE OF CONSULTATION:  Newly diagnosed breast cancer  HISTORY OF PRESENTING ILLNESS:  Candace Espinoza 53 y.o. female is here because of recent diagnosis of right breast cancer.   I reviewed her records extensively and collaborated the history with the patient.  SUMMARY OF ONCOLOGIC HISTORY: Oncology History  Malignant neoplasm of upper-outer quadrant of right breast in female, estrogen receptor positive (HCC)  09/12/2022 Mammogram   Screening mammogram showed possible mass in the upper outer right breast measuring about a centimeter at 10:00 4 cm from the nipple, sonographic imaging of the right axilla demonstrates normal lymph nodes.  No enlarged or abnormal lymph nodes.   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   12/04/2022 Cancer Staging   Staging form: Breast, AJCC 8th Edition - Pathologic stage from 12/04/2022: Stage IA (pT1c, pN0, cM0, G3, ER+, PR+, HER2-) - Signed by Rachel Moulds, MD on 12/10/2022 Stage prefix: Initial diagnosis Method of lymph node assessment: Axillary lymph node dissection Multigene prognostic tests performed: Oncotype DX Histologic  grading system: 3 grade system    Discussed the use of AI scribe software for clinical note transcription with the patient, who gave verbal consent to proceed.  History of Present Illness    The patient, who is currently undergoing radiation therapy for breast cancer, reports experiencing fatigue and skin changes as side effects of the treatment. She expresses readiness for the treatment to be over, indicating that the process is wearing on her. The patient is also concerned about the potential side effects of the next steps in her treatment plan, which include tamoxifen and ovarian suppression shots. She expresses particular concern about potential menopausal symptoms and weight gain. The patient also expresses anxiety about the potential for the cancer to return and is interested in a blood test that can detect tumor DNA. Rest of the pertinent 10 point ROS reviewed and negative.  MEDICAL HISTORY:  Past Medical History:  Diagnosis Date   Anemia    Vitamin B-12 Deficiency   Anxiety    Esophagitis    GERD (gastroesophageal reflux disease)     SURGICAL HISTORY: Past Surgical History:  Procedure Laterality Date   BREAST BIOPSY Right 09/17/2022   [place] RT BREAST BX W LOC DEV 1ST LESION IMG BX SPEC US GUIDE 09/17/2022 [facility]   BREAST BIOPSY  10/17/2022   MM RT RADIOACTIVE SEED LOC MAMMO GUIDE 10/17/2022 [facility]   BREAST LUMPECTOMY WITH RADIOACTIVE SEED AND SENTINEL LYMPH NODE BIOPSY Right 10/18/2022   Procedure: RIGHT BREAST SEED LUMPECTOMY, RIGHT SENTINEL LYMPH NODE MAPPING;  Surgeon: Harriette Bouillon, MD;  Location: [facility];  Service: General;  Laterality: Right;  PEC BLOCK   DILITATION & CURRETTAGE/HYSTROSCOPY WITH NOVASURE ABLATION N/A 11/24/2014   Procedure: Hysteroscopy DILATATION & CURETTAGE  WITH Failed  NOVASURE ABLATION;  Surgeon: Genia Del, MD;  Location: [facility];  Service: Gynecology;  Laterality: N/A;   UPPER GI ENDOSCOPY     WRIST  GANGLION EXCISION     Right    SOCIAL HISTORY: Social History   Socioeconomic History   Marital status: Married    Spouse name: Not on file   Number of children: Not on file   Years of education: Not on file   Highest education level: Not on file  Occupational History   Occupation: financial services    Employer: [employer]  Tobacco Use   Smoking status: Former    Current packs/day: 0.10    Average packs/day: 0.1 packs/day for 20.0 years (2.0 ttl pk-yrs)    Types: Cigarettes   Smokeless tobacco: Never  Vaping Use   Vaping status: Never Used  Substance and Sexual Activity   Alcohol use: Yes   Drug use: No   Sexual activity: Yes    Birth control/protection: Injection  Other Topics Concern   Not on file  Social History Narrative   Not on file   Social Drivers of Health   Financial Resource Strain: Low Risk  (03/24/2022)   Received from [facility]   Overall Financial Resource Strain (CARDIA)    Difficulty of Paying Living Expenses: Not hard at all  Food Insecurity: No Food Insecurity (12/04/2022)   Hunger Vital Sign    Worried About Running Out of Food in the Last Year: Never true    Ran Out of Food in the Last Year: Never true  Transportation Needs: No Transportation Needs (12/04/2022)   PRAPARE - Administrator, Civil Service (Medical): No    Lack of Transportation (Non-Medical): No  Physical Activity: Insufficiently Active (03/24/2022)   [facility]   Exercise Vital Sign    Days of Exercise per Week: 2 days    Minutes of Exercise per Session: 30 min  Stress: Stress Concern Present (03/24/2022)   Received from [facility] - Occupational Stress Questionnaire    Feeling of Stress : Very much  Social Connections: Socially Integrated (03/24/2022)   [facility]   Social Network    How would you rate your social network (family, work, friends)?: Good participation with  social networks  Intimate Partner Violence: Not At Risk (12/04/2022)   Humiliation, Afraid, Rape, and Kick questionnaire    Fear of Current or Ex-Partner: No    Emotionally Abused: No    Physically Abused: No    Sexually Abused: No    FAMILY HISTORY: Family History  Problem Relation Age of Onset   Prostate cancer Father 58   Stomach cancer Father 63   Lung cancer Maternal Grandfather        dx >50   Lung cancer Paternal Grandmother        dx > 50; smoking hx   Prostate cancer Paternal Grandfather        dx 58s   Diabetes Other        1st degree relative   Hypertension Other     ALLERGIES:  is allergic to codeine and doxycycline.  MEDICATIONS:  Current Outpatient Medications  Medication Sig Dispense Refill   tamoxifen (NOLVADEX) 20 MG tablet Take 1 tablet (20 mg total) by mouth daily. 90 tablet 3   albuterol (VENTOLIN HFA) 108 (90 Base) MCG/ACT inhaler INHALE 2 PUFFS BY MOUTH EVERY 6 HOURS AS NEEDED  FOR WHEEZE (Patient not taking: Reported on 12/04/2022)     ALPRAZolam (XANAX) 0.25 MG tablet Take 1 tablet (0.25 mg total) by mouth 3 (three) times daily as needed for anxiety. 30 tablet 2   Calcium-Magnesium-Vitamin D (CALCIUM MAGNESIUM PO) Take 2 tablets by mouth daily. (Patient not taking: Reported on 12/04/2022)     Cholecalciferol (VITAMIN D PO) Take 1 tablet by mouth daily. (Patient not taking: Reported on 12/04/2022)     Ciclopirox 1 % shampoo Massage into scalp and let sit 3 min then rinse--- use 2x a week x 4 weeks (Patient not taking: Reported on 12/04/2022) 120 mL 1   cyclobenzaprine (FLEXERIL) 10 MG tablet Take 1 tablet (10 mg total) by mouth 3 (three) times daily as needed for muscle spasms. (Patient not taking: Reported on 11/22/2014) 30 tablet 0   meclizine (ANTIVERT) 25 MG tablet Take 1 tablet (25 mg total) by mouth 3 (three) times daily as needed for dizziness. (Patient not taking: Reported on 11/22/2014) 30 tablet 0   oxyCODONE (OXY IR/ROXICODONE) 5 MG immediate release  tablet Take 1 tablet (5 mg total) by mouth every 6 (six) hours as needed for severe pain (pain score 7-10). (Patient not taking: Reported on 12/04/2022) 15 tablet 0   Probiotic Product (PROBIOTIC PO) Take 1 capsule by mouth daily.  (Patient not taking: Reported on 12/04/2022)     vitamin B-12 (CYANOCOBALAMIN) 100 MCG tablet Take 100 mcg by mouth daily. (Patient not taking: Reported on 12/04/2022)     No current facility-administered medications for this visit.    REVIEW OF SYSTEMS:   Constitutional: Denies fevers, chills or abnormal night sweats Eyes: Denies blurriness of vision, double vision or watery eyes Ears, nose, mouth, throat, and face: Denies mucositis or sore throat Respiratory: Denies cough, dyspnea or wheezes Cardiovascular: Denies palpitation, chest discomfort or lower extremity swelling Gastrointestinal:  Denies nausea, heartburn or change in bowel habits Skin: Denies abnormal skin rashes Lymphatics: Denies new lymphadenopathy or easy bruising Neurological:Denies numbness, tingling or new weaknesses Behavioral/Psych: Mood is stable, no new changes  Breast: Denies any palpable lumps or discharge All other systems were reviewed with the patient and are negative.  PHYSICAL EXAMINATION: ECOG PERFORMANCE STATUS: 0 - Asymptomatic  Vitals:   01/18/23 0848  BP: 116/67  Pulse: 79  Resp: 17  Temp: (!) 97.5 F (36.4 C)  SpO2: 100%    Filed Weights   01/18/23 0848  Weight: 132 lb 9.6 oz (60.1 kg)     GENERAL:alert, no distress and comfortable  LABORATORY DATA:  I have reviewed the data as listed Lab Results  Component Value Date   WBC 8.3 11/24/2014   HGB 11.5 (L) 11/24/2014   HCT 34.3 (L) 11/24/2014   MCV 93.7 11/24/2014   PLT 305 11/24/2014   Lab Results  Component Value Date   NA 138 06/23/2010   K 4.7 06/23/2010   CL 99 06/23/2010   CO2 26 06/23/2010    RADIOGRAPHIC STUDIES: I have personally reviewed the radiological reports and agreed with the  findings in the report.  ASSESSMENT AND PLAN:  Malignant neoplasm of upper-outer quadrant of right breast in female, estrogen receptor positive (HCC) This is a very pleasant 53 year old premenopausal female patient with newly diagnosed right breast grade 3 IDC, ER positive PR positive HER2 nonamplified, Ki-67 of 30% status post lumpectomy, Oncotype DX of 27 referred to medical oncology for additional recommendations.  Invasive Ductal Carcinoma, Right Breast Grade 3 tumor with high proliferation index, strongly positive estrogen  receptor, and moderately positive progesterone receptor. HER2 negative. Oncotype DX score of 27, about 16% risk of distant recurrence at 9 years without chemotherapy.  We do agree with the chemotherapy recommendations with [facility] team, I did recommend docetaxel and cyclophosphamide every 21 days for 4 cycles Discussed the benefits and risks of chemotherapy, radiation, and anti-estrogen therapy. -Recommend chemotherapy with Docetaxel and Cyclophosphamide every 21 days for 4 cycles. After much consideration, pt wanted to omit chemotherapy. Patient will be completing radiation therapy and is experiencing fatigue and skin changes. Discussed the benefits of adding ovarian suppression to the treatment plan in addition to the anti estrogen therapy.  -Start Zoladex shots next week and continue monthly thereafter. -Start Tamoxifen in early February, allowing for healing post-radiation. We discussed about OFS with AI vs OFS with tamoxifen and tamoxifen alone. Pt wants to try the OFS with tamoxifen. -Consider Guardant Reveal tumor DNA testing for early detection of recurrence.  Menopausal Symptoms Discussed potential side effects of ovarian suppression and Tamoxifen, including menopausal symptoms such as hot flashes, dryness, increased risk of DVT/PE, endometrial changes with tamoxifen including endometrial malignancy, mood swings, and potential weight gain due to slowed  metabolism. -Monitor for menopausal symptoms and adjust treatment as necessary. -Encourage healthy diet and exercise to manage potential weight gain.  Follow-up in approximately three months to assess tolerance and effectiveness of the combined treatment regimen.    All questions were answered. The patient knows to call the clinic with any problems, questions or concerns.    Rachel Moulds, MD 01/18/23

## 2023-01-18 NOTE — Assessment & Plan Note (Addendum)
This is a very pleasant 53 year old premenopausal female patient with newly diagnosed right breast grade 3 IDC, ER positive PR positive HER2 nonamplified, Ki-67 of 30% status post lumpectomy, Oncotype DX of 27 referred to medical oncology for additional recommendations.  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 HER2 negative. Oncotype DX score of 27, about 16% risk of distant recurrence at 9 years without chemotherapy.  We do agree with the chemotherapy recommendations with [facility] team, I did recommend docetaxel and cyclophosphamide every 21 days for 4 cycles Discussed the benefits and risks of chemotherapy, radiation, and anti-estrogen therapy. -Recommend chemotherapy with Docetaxel and Cyclophosphamide every 21 days for 4 cycles. After much consideration, pt wanted to omit chemotherapy. Patient will be completing radiation therapy and is experiencing fatigue and skin changes. Discussed the benefits of adding ovarian suppression to the treatment plan in addition to the anti estrogen therapy.  -Start Zoladex shots next week and continue monthly thereafter. -Start Tamoxifen in early February, allowing for healing post-radiation. We discussed about OFS with AI vs OFS with tamoxifen and tamoxifen alone. Pt wants to try the OFS with tamoxifen. -Consider Guardant Reveal tumor DNA testing for early detection of recurrence.  Menopausal Symptoms Discussed potential side effects of ovarian suppression and Tamoxifen, including menopausal symptoms such as hot flashes, dryness, increased risk of DVT/PE, endometrial changes with tamoxifen including endometrial malignancy, mood swings, and potential weight gain due to slowed metabolism. -Monitor for menopausal symptoms and adjust treatment as necessary. -Encourage healthy diet and exercise to manage potential weight gain.  Follow-up in  approximately three months to assess tolerance and effectiveness of the combined treatment regimen.

## 2023-01-21 ENCOUNTER — Telehealth: Payer: Self-pay | Admitting: Hematology and Oncology

## 2023-01-21 ENCOUNTER — Ambulatory Visit
Admission: RE | Admit: 2023-01-21 | Discharge: 2023-01-21 | Disposition: A | Discharge status: Home / Self Care | Payer: 59 | Source: Ambulatory Visit | Attending: Radiation Oncology | Admitting: Radiation Oncology

## 2023-01-21 ENCOUNTER — Other Ambulatory Visit: Payer: Self-pay

## 2023-01-21 DIAGNOSIS — Z5111 Encounter for antineoplastic chemotherapy: Secondary | ICD-10-CM | POA: Diagnosis not present

## 2023-01-21 LAB — RAD ONC ARIA SESSION SUMMARY
Course Elapsed Days: 21
Plan Fractions Treated to Date: 15
Plan Prescribed Dose Per Fraction: 2.67 Gy
Plan Total Fractions Prescribed: 15
Plan Total Prescribed Dose: 40.05 Gy
Reference Point Dosage Given to Date: 40.05 Gy
Reference Point Session Dosage Given: 2.67 Gy
Session Number: 15

## 2023-01-21 NOTE — Telephone Encounter (Signed)
Spoke with patient confirming upcoming appointment  

## 2023-01-22 ENCOUNTER — Ambulatory Visit: Payer: 59

## 2023-01-22 ENCOUNTER — Ambulatory Visit: Payer: 59 | Admitting: Radiation Oncology

## 2023-01-22 ENCOUNTER — Other Ambulatory Visit: Payer: Self-pay

## 2023-01-22 ENCOUNTER — Ambulatory Visit
Admission: RE | Admit: 2023-01-22 | Discharge: 2023-01-22 | Disposition: A | Discharge status: Home / Self Care | Payer: 59 | Source: Ambulatory Visit | Attending: Radiation Oncology | Admitting: Radiation Oncology

## 2023-01-22 DIAGNOSIS — Z5111 Encounter for antineoplastic chemotherapy: Secondary | ICD-10-CM | POA: Diagnosis not present

## 2023-01-22 LAB — RAD ONC ARIA SESSION SUMMARY
Course Elapsed Days: 22
Plan Fractions Treated to Date: 1
Plan Prescribed Dose Per Fraction: 2 Gy
Plan Total Fractions Prescribed: 5
Plan Total Prescribed Dose: 10 Gy
Reference Point Dosage Given to Date: 2 Gy
Reference Point Session Dosage Given: 2 Gy
Session Number: 16

## 2023-01-23 ENCOUNTER — Ambulatory Visit
Admission: RE | Admit: 2023-01-23 | Discharge: 2023-01-23 | Disposition: A | Discharge status: Home / Self Care | Payer: 59 | Source: Ambulatory Visit | Attending: Radiation Oncology

## 2023-01-23 ENCOUNTER — Other Ambulatory Visit: Payer: Self-pay

## 2023-01-23 ENCOUNTER — Ambulatory Visit: Payer: 59

## 2023-01-23 DIAGNOSIS — Z5111 Encounter for antineoplastic chemotherapy: Secondary | ICD-10-CM | POA: Diagnosis not present

## 2023-01-23 LAB — RAD ONC ARIA SESSION SUMMARY
Course Elapsed Days: 23
Plan Fractions Treated to Date: 2
Plan Prescribed Dose Per Fraction: 2 Gy
Plan Total Fractions Prescribed: 5
Plan Total Prescribed Dose: 10 Gy
Reference Point Dosage Given to Date: 4 Gy
Reference Point Session Dosage Given: 2 Gy
Session Number: 17

## 2023-01-24 ENCOUNTER — Inpatient Hospital Stay: Payer: 59 | Admitting: Licensed Clinical Social Worker

## 2023-01-24 ENCOUNTER — Ambulatory Visit: Payer: 59

## 2023-01-25 ENCOUNTER — Ambulatory Visit
Admission: RE | Admit: 2023-01-25 | Discharge: 2023-01-25 | Disposition: A | Discharge status: Home / Self Care | Payer: 59 | Source: Ambulatory Visit | Attending: Radiation Oncology | Admitting: Radiation Oncology

## 2023-01-25 ENCOUNTER — Other Ambulatory Visit: Payer: Self-pay

## 2023-01-25 ENCOUNTER — Inpatient Hospital Stay: Payer: 59

## 2023-01-25 ENCOUNTER — Ambulatory Visit: Payer: 59

## 2023-01-25 DIAGNOSIS — Z5111 Encounter for antineoplastic chemotherapy: Secondary | ICD-10-CM | POA: Diagnosis not present

## 2023-01-25 DIAGNOSIS — Z170 Estrogen receptor positive status [ER+]: Secondary | ICD-10-CM

## 2023-01-25 LAB — RAD ONC ARIA SESSION SUMMARY
Course Elapsed Days: 25
Plan Fractions Treated to Date: 3
Plan Prescribed Dose Per Fraction: 2 Gy
Plan Total Fractions Prescribed: 5
Plan Total Prescribed Dose: 10 Gy
Reference Point Dosage Given to Date: 6 Gy
Reference Point Session Dosage Given: 2 Gy
Session Number: 18

## 2023-01-25 MED ORDER — GOSERELIN ACETATE 3.6 MG ~~LOC~~ IMPL
3.6000 mg | DRUG_IMPLANT | Freq: Once | SUBCUTANEOUS | Status: AC
  Administered 2023-01-25: 3.6 mg via SUBCUTANEOUS

## 2023-01-28 ENCOUNTER — Ambulatory Visit
Admission: RE | Admit: 2023-01-28 | Discharge: 2023-01-28 | Disposition: A | Discharge status: Home / Self Care | Payer: 59 | Source: Ambulatory Visit | Attending: Radiation Oncology

## 2023-01-28 ENCOUNTER — Other Ambulatory Visit: Payer: Self-pay

## 2023-01-28 ENCOUNTER — Ambulatory Visit: Payer: 59

## 2023-01-28 DIAGNOSIS — Z5111 Encounter for antineoplastic chemotherapy: Secondary | ICD-10-CM | POA: Diagnosis not present

## 2023-01-28 LAB — RAD ONC ARIA SESSION SUMMARY
Course Elapsed Days: 28
Plan Fractions Treated to Date: 4
Plan Prescribed Dose Per Fraction: 2 Gy
Plan Total Fractions Prescribed: 5
Plan Total Prescribed Dose: 10 Gy
Reference Point Dosage Given to Date: 8 Gy
Reference Point Session Dosage Given: 2 Gy
Session Number: 19

## 2023-01-29 ENCOUNTER — Other Ambulatory Visit: Payer: Self-pay

## 2023-01-29 ENCOUNTER — Inpatient Hospital Stay: Payer: 59 | Admitting: Licensed Clinical Social Worker

## 2023-01-29 ENCOUNTER — Ambulatory Visit
Admission: RE | Admit: 2023-01-29 | Discharge: 2023-01-29 | Disposition: A | Discharge status: Home / Self Care | Payer: 59 | Source: Ambulatory Visit | Attending: Radiation Oncology | Admitting: Radiation Oncology

## 2023-01-29 DIAGNOSIS — Z5111 Encounter for antineoplastic chemotherapy: Secondary | ICD-10-CM | POA: Diagnosis not present

## 2023-01-29 LAB — RAD ONC ARIA SESSION SUMMARY
Course Elapsed Days: 29
Plan Fractions Treated to Date: 5
Plan Prescribed Dose Per Fraction: 2 Gy
Plan Total Fractions Prescribed: 5
Plan Total Prescribed Dose: 10 Gy
Reference Point Dosage Given to Date: 10 Gy
Reference Point Session Dosage Given: 2 Gy
Session Number: 20

## 2023-01-30 ENCOUNTER — Inpatient Hospital Stay: Payer: 59

## 2023-01-30 NOTE — Radiation Completion Notes (Signed)
Patient Name: Candace Espinoza, Candace Espinoza MRN: [id number] Date of Birth: [birth year] Referring Physician: Harriette Bouillon, M.D. Date of Service: 2023-01-30 Radiation Oncologist: Lonie Peak, M.D. [facility]                             RADIATION ONCOLOGY END OF TREATMENT NOTE     Diagnosis: C50.411 Malignant neoplasm of upper-outer quadrant of right female breast Staging on 2022-12-04: Malignant neoplasm of upper-outer quadrant of right breast in female, estrogen receptor positive (HCC) T=pT1c, N=pN0, M=cM0 Intent: Curative     ==========DELIVERED PLANS==========  First Treatment Date: 2022-12-31 Last Treatment Date: 2023-01-29   Plan Name: Breast_R Site: Breast, Right Technique: 3D Mode: Photon Dose Per Fraction: 2.67 Gy Prescribed Dose (Delivered / Prescribed): 40.05 Gy / 40.05 Gy Prescribed Fxs (Delivered / Prescribed): 15 / 15   Plan Name: Breast_R_Bst Site: Breast, Right Technique: Electron Mode: Electron Dose Per Fraction: 2 Gy Prescribed Dose (Delivered / Prescribed): 10 Gy / 10 Gy Prescribed Fxs (Delivered / Prescribed): 5 / 5     ==========ON TREATMENT VISIT DATES========== 2023-01-07, 2023-01-14, 2023-01-18, 2023-01-28     ==========UPCOMING VISITS========== 03/29/2023 [facility] INJECTION [facility] FLUSH  03/29/2023 [facility] ONCOLOGY SURVIVORSHIP CARE PLAN VISIT Loa Socks, NP  03/29/2023 [facility] LAB [facility] LAB  03/01/2023 [facility] INJECTION [facility] FLUSH  02/28/2023 [facility] ONC FOLLOW UP 20 Lonie Peak, MD        ==========APPENDIX - ON TREATMENT VISIT NOTES==========   See weekly On Treatment Notes in Epic for details in the Media tab (listed as Progress notes on the On Treatment Visit Dates listed above).

## 2023-02-14 NOTE — Progress Notes (Incomplete)
  Candace Espinoza presents today for follow-up after completing radiation to her right breast on 01/29/2023.  Pain: Skin:  ROM:  Lymphedema:  MedOnc F/U:  Other issues of note:   Pt reports Yes No Comments  Tamoxifen []  []    Letrozole []  []    Anastrazole []  []    Mammogram []  Date:  []

## 2023-02-18 ENCOUNTER — Telehealth: Payer: Self-pay | Admitting: Adult Health

## 2023-02-18 NOTE — Telephone Encounter (Signed)
Received an in basket message to reschedule patient's injection appointment. Appointment rescheduled and patient has been informed.

## 2023-02-20 NOTE — Progress Notes (Incomplete)
  Marguerette A Webb Silversmith presents today for follow-up after completing radiation to her right breast on 01/29/2023.  Pain: Patient has 2 out of 10 intermittent shooting pain in right breast and occasional discomfort. Surgical scars are more sensitive, encouraged patient to monitor for any changes Skin: Skin discoloration is subsiding, patient using Vitamin E oil regularly. ROM: Patient denies any issues with range of motion  Lymphedema: Patient denies any swelling  MedOnc F/U: Follow up with Mardella Layman NP for Survivorship on 03/29/2023 Other issues of note: None  Pt reports Yes No Comments  Tamoxifen [x]  []   Patient began medication on 12/14.  Letrozole []  []    Anastrazole []  []    Mammogram [x]  Date: September 2024  []      Patient is doing well and healing well after surgery. Encouraged to come to follow up appointments and any upcoming mammograms.

## 2023-02-21 ENCOUNTER — Ambulatory Visit: Payer: 59 | Admitting: Radiation Oncology

## 2023-02-22 ENCOUNTER — Encounter: Payer: Self-pay | Admitting: Hematology and Oncology

## 2023-02-22 ENCOUNTER — Inpatient Hospital Stay: Payer: 59 | Attending: Radiation Oncology

## 2023-02-22 ENCOUNTER — Inpatient Hospital Stay: Payer: 59

## 2023-02-22 VITALS — BP 116/94 | HR 88 | Temp 97.5°F | Resp 16

## 2023-02-22 DIAGNOSIS — C50411 Malignant neoplasm of upper-outer quadrant of right female breast: Secondary | ICD-10-CM | POA: Diagnosis present

## 2023-02-22 DIAGNOSIS — Z5111 Encounter for antineoplastic chemotherapy: Secondary | ICD-10-CM | POA: Diagnosis present

## 2023-02-22 DIAGNOSIS — Z170 Estrogen receptor positive status [ER+]: Secondary | ICD-10-CM | POA: Insufficient documentation

## 2023-02-22 DIAGNOSIS — Z79899 Other long term (current) drug therapy: Secondary | ICD-10-CM | POA: Diagnosis not present

## 2023-02-22 MED ORDER — GOSERELIN ACETATE 3.6 MG ~~LOC~~ IMPL
3.6000 mg | DRUG_IMPLANT | Freq: Once | SUBCUTANEOUS | Status: AC
  Administered 2023-02-22: 3.6 mg via SUBCUTANEOUS
  Filled 2023-02-22: qty 3.6

## 2023-02-22 NOTE — Progress Notes (Signed)
Pt. Here for Zoladex injection.  States she scheduled for Labs on 03/29/23.  Wanted to know why she was scheduled for labs?  Secure chatted Dr. Al Pimple and Val/RN.  States pt. Is scheduled for Survivorship with Mardella Layman on 03/29/23.  Will need Guardant Kit done on 03/29/23.  Pt. Made aware and states she thinks she will move forward with having the labs done.  Dr. Al Pimple and Van/RN made aware.

## 2023-02-28 ENCOUNTER — Ambulatory Visit: Payer: 59 | Admitting: Radiation Oncology

## 2023-03-01 ENCOUNTER — Inpatient Hospital Stay: Payer: 59

## 2023-03-28 ENCOUNTER — Other Ambulatory Visit: Payer: Self-pay | Admitting: *Deleted

## 2023-03-28 DIAGNOSIS — Z170 Estrogen receptor positive status [ER+]: Secondary | ICD-10-CM

## 2023-03-29 ENCOUNTER — Inpatient Hospital Stay: Payer: 59

## 2023-03-29 ENCOUNTER — Encounter: Payer: Self-pay | Admitting: Adult Health

## 2023-03-29 ENCOUNTER — Inpatient Hospital Stay (HOSPITAL_BASED_OUTPATIENT_CLINIC_OR_DEPARTMENT_OTHER): Payer: 59 | Admitting: Adult Health

## 2023-03-29 ENCOUNTER — Inpatient Hospital Stay: Payer: 59 | Attending: Radiation Oncology

## 2023-03-29 VITALS — BP 125/71 | HR 71 | Temp 98.2°F | Resp 16 | Wt 123.8 lb

## 2023-03-29 DIAGNOSIS — Z881 Allergy status to other antibiotic agents status: Secondary | ICD-10-CM | POA: Insufficient documentation

## 2023-03-29 DIAGNOSIS — Z8042 Family history of malignant neoplasm of prostate: Secondary | ICD-10-CM | POA: Diagnosis not present

## 2023-03-29 DIAGNOSIS — Z885 Allergy status to narcotic agent status: Secondary | ICD-10-CM | POA: Diagnosis not present

## 2023-03-29 DIAGNOSIS — Z801 Family history of malignant neoplasm of trachea, bronchus and lung: Secondary | ICD-10-CM | POA: Insufficient documentation

## 2023-03-29 DIAGNOSIS — Z800 Family history of malignant neoplasm of digestive organs: Secondary | ICD-10-CM | POA: Insufficient documentation

## 2023-03-29 DIAGNOSIS — Z1721 Progesterone receptor positive status: Secondary | ICD-10-CM | POA: Diagnosis not present

## 2023-03-29 DIAGNOSIS — C50411 Malignant neoplasm of upper-outer quadrant of right female breast: Secondary | ICD-10-CM | POA: Insufficient documentation

## 2023-03-29 DIAGNOSIS — Z1732 Human epidermal growth factor receptor 2 negative status: Secondary | ICD-10-CM | POA: Diagnosis not present

## 2023-03-29 DIAGNOSIS — Z8249 Family history of ischemic heart disease and other diseases of the circulatory system: Secondary | ICD-10-CM | POA: Diagnosis not present

## 2023-03-29 DIAGNOSIS — R200 Anesthesia of skin: Secondary | ICD-10-CM | POA: Insufficient documentation

## 2023-03-29 DIAGNOSIS — Z170 Estrogen receptor positive status [ER+]: Secondary | ICD-10-CM | POA: Diagnosis not present

## 2023-03-29 DIAGNOSIS — Z833 Family history of diabetes mellitus: Secondary | ICD-10-CM | POA: Diagnosis not present

## 2023-03-29 DIAGNOSIS — Z79810 Long term (current) use of selective estrogen receptor modulators (SERMs): Secondary | ICD-10-CM | POA: Insufficient documentation

## 2023-03-29 DIAGNOSIS — Z923 Personal history of irradiation: Secondary | ICD-10-CM | POA: Diagnosis not present

## 2023-03-29 DIAGNOSIS — Z87891 Personal history of nicotine dependence: Secondary | ICD-10-CM | POA: Diagnosis not present

## 2023-03-29 DIAGNOSIS — Z79899 Other long term (current) drug therapy: Secondary | ICD-10-CM | POA: Insufficient documentation

## 2023-03-29 DIAGNOSIS — Z5111 Encounter for antineoplastic chemotherapy: Secondary | ICD-10-CM | POA: Insufficient documentation

## 2023-03-29 LAB — CBC WITH DIFFERENTIAL (CANCER CENTER ONLY)
Abs Immature Granulocytes: 0.01 10*3/uL (ref 0.00–0.07)
Basophils Absolute: 0.1 10*3/uL (ref 0.0–0.1)
Basophils Relative: 2 %
Eosinophils Absolute: 0.1 10*3/uL (ref 0.0–0.5)
Eosinophils Relative: 2 %
HCT: 35.9 % — ABNORMAL LOW (ref 36.0–46.0)
Hemoglobin: 12.2 g/dL (ref 12.0–15.0)
Immature Granulocytes: 0 %
Lymphocytes Relative: 27 %
Lymphs Abs: 1 10*3/uL (ref 0.7–4.0)
MCH: 31 pg (ref 26.0–34.0)
MCHC: 34 g/dL (ref 30.0–36.0)
MCV: 91.3 fL (ref 80.0–100.0)
Monocytes Absolute: 0.3 10*3/uL (ref 0.1–1.0)
Monocytes Relative: 7 %
Neutro Abs: 2.2 10*3/uL (ref 1.7–7.7)
Neutrophils Relative %: 62 %
Platelet Count: 311 10*3/uL (ref 150–400)
RBC: 3.93 MIL/uL (ref 3.87–5.11)
RDW: 13.8 % (ref 11.5–15.5)
WBC Count: 3.6 10*3/uL — ABNORMAL LOW (ref 4.0–10.5)
nRBC: 0 % (ref 0.0–0.2)

## 2023-03-29 LAB — CMP (CANCER CENTER ONLY)
ALT: 14 U/L (ref 0–44)
AST: 18 U/L (ref 15–41)
Albumin: 4.8 g/dL (ref 3.5–5.0)
Alkaline Phosphatase: 51 U/L (ref 38–126)
Anion gap: 7 (ref 5–15)
BUN: 12 mg/dL (ref 6–20)
CO2: 28 mmol/L (ref 22–32)
Calcium: 9.4 mg/dL (ref 8.9–10.3)
Chloride: 104 mmol/L (ref 98–111)
Creatinine: 0.55 mg/dL (ref 0.44–1.00)
GFR, Estimated: >60 mL/min (ref >60)
Glucose, Bld: 101 mg/dL — ABNORMAL HIGH (ref 70–99)
Potassium: 4.3 mmol/L (ref 3.5–5.1)
Sodium: 139 mmol/L (ref 135–145)
Total Bilirubin: 0.6 mg/dL (ref 0.0–1.2)
Total Protein: 7.7 g/dL (ref 6.5–8.1)

## 2023-03-29 MED ORDER — GOSERELIN ACETATE 3.6 MG ~~LOC~~ IMPL
3.6000 mg | DRUG_IMPLANT | Freq: Once | SUBCUTANEOUS | Status: AC
  Administered 2023-03-29: 3.6 mg via SUBCUTANEOUS
  Filled 2023-03-29: qty 3.6

## 2023-03-29 NOTE — Progress Notes (Signed)
 SURVIVORSHIP VISIT:  BRIEF ONCOLOGIC HISTORY:  Oncology History  Malignant neoplasm of upper-outer quadrant of right breast in female, estrogen receptor positive (HCC)  09/12/2022 Mammogram   Screening mammogram showed possible mass in the upper outer right breast measuring about a centimeter at 10:00 4 cm from the nipple, sonographic imaging of the right axilla demonstrates normal lymph nodes.  No enlarged or abnormal lymph nodes.   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   12/04/2022 Cancer Staging   Staging form: Breast, AJCC 8th Edition - Pathologic stage from 12/04/2022: Stage IA (pT1c, pN0, cM0, G3, ER+, PR+, HER2-) - Signed by Rachel Moulds, MD on 12/10/2022 Stage prefix: Initial diagnosis Method of lymph node assessment: Axillary lymph node dissection Multigene prognostic tests performed: Oncotype DX Histologic grading system: 3 grade system   12/31/2022 - 01/29/2023 Radiation Therapy   Plan Name: Breast_R Site: Breast, Right Technique: 3D Mode: Photon Dose Per Fraction: 2.67 Gy Prescribed Dose (Delivered / Prescribed): 40.05 Gy / 40.05 Gy Prescribed Fxs (Delivered / Prescribed): 15 / 15   Plan Name: Breast_R_Bst Site: Breast, Right Technique: Electron Mode: Electron Dose Per Fraction: 2 Gy Prescribed Dose (Delivered / Prescribed): 10 Gy / 10 Gy Prescribed Fxs (Delivered / Prescribed): 5 / 5   02/2023 -  Anti-estrogen oral therapy   Tamoxifen      INTERVAL HISTORY:  Candace Espinoza to review her survivorship care plan detailing her treatment course for breast cancer, as well as monitoring long-term side effects of that treatment, education regarding health maintenance, screening, and overall wellness and health promotion.     Overall, Candace Espinoza reports feeling quite well. The patient reports numbness in the arm, possibly due to lymphedema. She is currently on tamoxifen and Zoladex. The patient reports hot ankles and feeling generally hot, but no other significant side effects from the tamoxifen. She has not had a menstrual cycle since March, likely due to the tamoxifen and the injections. The patient is due for a mammogram in September and is scheduled for regular injections every four weeks.  REVIEW OF SYSTEMS:  Review of Systems  Constitutional:  Negative for appetite change, chills, fatigue, fever and unexpected weight change.  HENT:   Negative for hearing loss, lump/mass and trouble swallowing.   Eyes:  Negative for eye problems and icterus.  Respiratory:  Negative for chest tightness, cough and shortness of breath.   Cardiovascular:  Negative for chest pain, leg swelling and palpitations.  Gastrointestinal:  Negative for abdominal distention, abdominal pain, constipation, diarrhea, nausea and vomiting.  Endocrine: Positive for hot flashes.  Genitourinary:  Negative for difficulty urinating.   Musculoskeletal:  Negative for arthralgias.  Skin:  Negative for itching and rash.  Neurological:  Negative for dizziness, extremity weakness, headaches and numbness.  Hematological:  Negative for adenopathy. Does not bruise/bleed easily.  Psychiatric/Behavioral:  Negative for depression. The patient is not nervous/anxious.    Breast: Denies any new nodularity, masses, tenderness, nipple changes, or nipple discharge.       PAST MEDICAL/SURGICAL HISTORY:  Past Medical  History:  Diagnosis Date   Anemia    Vitamin B-12 Deficiency    Anxiety    Esophagitis    GERD (gastroesophageal reflux disease)    Past Surgical History:  Procedure Laterality Date   BREAST BIOPSY Right 09/17/2022   [place] RT BREAST BX W LOC DEV 1ST LESION IMG BX SPEC US GUIDE 09/17/2022 [facility]   BREAST BIOPSY  10/17/2022   MM RT RADIOACTIVE SEED LOC MAMMO GUIDE 10/17/2022 [facility]   BREAST LUMPECTOMY WITH RADIOACTIVE SEED AND SENTINEL LYMPH NODE BIOPSY Right 10/18/2022   Procedure: RIGHT BREAST SEED LUMPECTOMY, RIGHT SENTINEL LYMPH NODE MAPPING;  Surgeon: Harriette Bouillon, MD;  Location: [facility];  Service: General;  Laterality: Right;  PEC BLOCK   DILITATION & CURRETTAGE/HYSTROSCOPY WITH NOVASURE ABLATION N/A 11/24/2014   Procedure: Hysteroscopy DILATATION & CURETTAGE  WITH Failed  NOVASURE ABLATION;  Surgeon: Genia Del, MD;  Location: [facility];  Service: Gynecology;  Laterality: N/A;   UPPER GI ENDOSCOPY     WRIST GANGLION EXCISION     Right     ALLERGIES:  Allergies  Allergen Reactions   Codeine Itching   Doxycycline Other (See Comments)    Caused ulcers     CURRENT MEDICATIONS:  Outpatient Encounter Medications as of 03/29/2023  Medication Sig Note   albuterol (VENTOLIN HFA) 108 (90 Base) MCG/ACT inhaler     ALPRAZolam (XANAX) 0.25 MG tablet Take 1 tablet (0.25 mg total) by mouth 3 (three) times daily as needed for anxiety.    cyclobenzaprine (FLEXERIL) 10 MG tablet Take 1 tablet (10 mg total) by mouth 3 (three) times daily as needed for muscle spasms.    ibuprofen (ADVIL) 800 MG tablet Take 800 mg by mouth.    meclizine (ANTIVERT) 25 MG tablet Take 1 tablet (25 mg total) by mouth 3 (three) times daily as needed for dizziness.    tamoxifen (NOLVADEX) 20 MG tablet Take 1 tablet (20 mg total) by mouth daily.    vitamin B-12 (CYANOCOBALAMIN) 100 MCG tablet Take 100 mcg by mouth daily.    Calcium-Magnesium-Vitamin D (CALCIUM MAGNESIUM PO) Take 2 tablets by mouth daily. (Patient not taking: Reported on  12/04/2022)    Cholecalciferol (VITAMIN D PO) Take 1 tablet by mouth daily. (Patient not taking: Reported on 12/04/2022)    Ciclopirox 1 % shampoo Massage into scalp and let sit 3 min then rinse--- use 2x a week x 4 weeks (Patient not taking: Reported on 12/04/2022) 10/18/2022: More than a year   oxyCODONE (OXY IR/ROXICODONE) 5 MG immediate release tablet Take 1 tablet (5 mg total) by mouth every 6 (six) hours as needed for severe pain (pain score 7-10). (Patient not taking: Reported on 03/29/2023)    Probiotic Product (PROBIOTIC PO) Take 1 capsule by mouth daily.  (Patient not taking: Reported on 12/04/2022)    No facility-administered encounter medications on file as of 03/29/2023.     ONCOLOGIC FAMILY HISTORY:  Family History  Problem Relation Age of Onset   Prostate cancer Father 73   Stomach cancer Father 4   Lung cancer Maternal Grandfather        dx >50   Lung cancer Paternal Grandmother        dx > 50; smoking hx   Prostate cancer Paternal Grandfather        dx 13s   Diabetes Other        1st degree relative   Hypertension Other      SOCIAL HISTORY:  Social History  Socioeconomic History   Marital status: Married    Spouse name: Not on file   Number of children: Not on file   Years of education: Not on file   Highest education level: Not on file  Occupational History   Occupation: financial services    Employer: [employer]  Tobacco Use   Smoking status: Former    Current packs/day: 0.10    Average packs/day: 0.1 packs/day for 20.0 years (2.0 ttl pk-yrs)    Types: Cigarettes   Smokeless tobacco: Never  Vaping Use   Vaping status: Never Used  Substance and Sexual Activity   Alcohol use: Yes   Drug use: No   Sexual activity: Yes    Birth control/protection: Injection  Other Topics Concern   Not on file  Social History Narrative   Not on file   Social Drivers of Health   Financial Resource Strain: Low Risk  (03/24/2022)   Received from [facility]   Overall Financial Resource Strain (CARDIA)    Difficulty of Paying Living Expenses: Not hard at all  Food Insecurity: No Food Insecurity (12/04/2022)   Hunger Vital Sign    Worried About Running Out of Food in the Last Year: Never true    Ran Out of Food in the Last Year: Never true  Transportation Needs: No Transportation Needs (12/04/2022)   PRAPARE - Administrator, Civil Service (Medical): No    Lack of Transportation (Non-Medical): No  Physical Activity: Insufficiently Active (03/24/2022)   Received from [facility]   Exercise Vital Sign    Days of Exercise per Week: 2 days    Minutes of Exercise per Session: 30 min  Stress: Stress Concern Present (03/24/2022)   Received from [facility] - Occupational Stress Questionnaire    Feeling of Stress : Very much  Social Connections: Socially Integrated (03/24/2022)   Received from [facility]   Social Network    How would you rate your social network (family, work, friends)?: Good participation with social networks  Intimate Partner Violence: Not At Risk (12/04/2022)   Humiliation, Afraid, Rape, and Kick questionnaire    Fear of Current or Ex-Partner: No    Emotionally Abused: No    Physically Abused: No    Sexually Abused: No     OBSERVATIONS/OBJECTIVE:  BP 125/71   Pulse 71   Temp 98.2 F (36.8 C) (Oral)   Resp 16   Wt 123 lb 12.8 oz (56.2 kg)   SpO2 100%   BMI 22.64 kg/m  GENERAL: Patient is a well appearing female in no acute distress HEENT:  Sclerae anicteric.  Oropharynx clear and moist. No ulcerations or evidence of oropharyngeal candidiasis. Neck is supple.  NODES:  No cervical, supraclavicular, or axillary lymphadenopathy palpated.  BREAST EXAM:  Right breast s/p lumpectomy and radiation, no sign of local recurrence, left breast benign LUNGS:  Clear to auscultation bilaterally.  No wheezes or rhonchi. HEART:  Regular rate and rhythm. No murmur  appreciated. ABDOMEN:  Soft, nontender.  Positive, normoactive bowel sounds. No organomegaly palpated. MSK:  No focal spinal tenderness to palpation. Full range of motion bilaterally in the upper extremities. EXTREMITIES:  No peripheral edema.   SKIN:  Clear with no obvious rashes or skin changes. No nail dyscrasia. NEURO:  Nonfocal. Well oriented.  Appropriate affect.   LABORATORY DATA:  None for this visit.  DIAGNOSTIC IMAGING:  None for this visit.  ASSESSMENT AND PLAN:  Ms.. Espinoza is a pleasant 53 y.o. 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  She presents to the Survivorship Clinic for our initial meeting and routine follow-up post-completion of treatment for breast cancer.    1. Stage IA right breast cancer:  Candace Espinoza is continuing to recover from definitive treatment for breast cancer. She will follow-up with her medical oncologist, Dr.  Al Pimple in 4-5 months with history and physical exam per surveillance protocol.  She will continue her anti-estrogen therapy with Tamoxifen/Zoladex. Thus far, she is tolerating the treatment well, with minimal side effects. Her mammogram is due 09/2023; orders placed today.   Today, a comprehensive survivorship care plan and treatment summary was reviewed with the patient today detailing her breast cancer diagnosis, treatment course, potential late/long-term effects of treatment, appropriate follow-up care with recommendations for the future, and patient education resources.  A copy of this summary, along with a letter will be sent to the patient's primary care provider via mail/fax/In Basket message after today's visit.    2. Bone health:  She was given education on specific activities to promote bone health.  3. Cancer screening:  Due to Candace Espinoza's history and her age, she  should receive screening for skin cancers, colon cancer, and gynecologic cancers.  The information and recommendations are listed on the patient's comprehensive care plan/treatment summary and were reviewed in detail with the patient.    4. Health maintenance and wellness promotion: Candace Espinoza was encouraged to consume 5-7 servings of fruits and vegetables per day. We reviewed the "Nutrition Rainbow" handout.  She was also encouraged to engage in moderate to vigorous exercise for 30 minutes per day most days of the week.  She was instructed to limit her alcohol consumption and continue to abstain from tobacco use.     5. Support services/counseling: It is not uncommon for this period of the patient's cancer care trajectory to be one of many emotions and stressors.   She was given information regarding our available services and encouraged to contact me with any questions or for help enrolling in any of our support group/programs.    Follow up instructions:    -Return to cancer center in 6 months fo rf/u  -Mammogram due in 09/2023 -She is welcome to return back to the Survivorship Clinic at any time; no additional follow-up needed at this time.  -Consider referral back to survivorship as a long-term survivor for continued surveillance  The patient was provided an opportunity to ask questions and all were answered. The patient agreed with the plan and demonstrated an understanding of the instructions.   Total encounter time:40 minutes*in face-to-face visit time, chart review, lab review, care coordination, order entry, and documentation of the encounter time.    Lillard Anes, NP 03/29/23 12:01 PM Medical Oncology and Hematology [facility] [address] [place], [place] [address] Tel. [phone]    Fax. [phone]  *Total Encounter Time as defined by the Centers for Medicare and Medicaid Services includes, in addition to the face-to-face time of a patient visit (documented  in the note above) non-face-to-face time: obtaining and reviewing outside history, ordering and reviewing medications, tests or procedures, care coordination (communications with other health care professionals or caregivers) and documentation in the medical record.

## 2023-03-30 ENCOUNTER — Telehealth: Payer: Self-pay | Admitting: Hematology and Oncology

## 2023-03-30 NOTE — Telephone Encounter (Signed)
 Spoke with patient confirming upcoming appointment

## 2023-04-01 ENCOUNTER — Encounter: Payer: Self-pay | Admitting: Hematology and Oncology

## 2023-04-01 LAB — GENETIC SCREENING ORDER

## 2023-04-13 ENCOUNTER — Encounter: Payer: Self-pay | Admitting: Hematology and Oncology

## 2023-04-15 ENCOUNTER — Telehealth: Payer: Self-pay | Admitting: Genetic Counselor

## 2023-04-15 NOTE — Telephone Encounter (Signed)
 Patient called requesting genetic testing results.  Told patient that hereditary cancer genetic testing labs were not ordered through our office.  She said she did have genetics labs drawn (remembers Guardant kit) on 3/28 and is requesting someone follow up with her regarding what labs were drawn and what the results are.  Message sent to providers requesting follow up.

## 2023-04-18 ENCOUNTER — Encounter: Payer: Self-pay | Admitting: Hematology and Oncology

## 2023-04-26 ENCOUNTER — Inpatient Hospital Stay: Attending: Radiation Oncology

## 2023-04-26 VITALS — BP 114/81 | HR 71 | Temp 99.1°F | Resp 14

## 2023-04-26 DIAGNOSIS — Z87891 Personal history of nicotine dependence: Secondary | ICD-10-CM | POA: Insufficient documentation

## 2023-04-26 DIAGNOSIS — Z170 Estrogen receptor positive status [ER+]: Secondary | ICD-10-CM | POA: Insufficient documentation

## 2023-04-26 DIAGNOSIS — Z1721 Progesterone receptor positive status: Secondary | ICD-10-CM | POA: Diagnosis not present

## 2023-04-26 DIAGNOSIS — C50411 Malignant neoplasm of upper-outer quadrant of right female breast: Secondary | ICD-10-CM | POA: Diagnosis present

## 2023-04-26 DIAGNOSIS — Z79899 Other long term (current) drug therapy: Secondary | ICD-10-CM | POA: Diagnosis not present

## 2023-04-26 DIAGNOSIS — Z1732 Human epidermal growth factor receptor 2 negative status: Secondary | ICD-10-CM | POA: Diagnosis not present

## 2023-04-26 DIAGNOSIS — Z5111 Encounter for antineoplastic chemotherapy: Secondary | ICD-10-CM | POA: Diagnosis present

## 2023-04-26 MED ORDER — GOSERELIN ACETATE 3.6 MG ~~LOC~~ IMPL
3.6000 mg | DRUG_IMPLANT | Freq: Once | SUBCUTANEOUS | Status: AC
  Administered 2023-04-26: 3.6 mg via SUBCUTANEOUS
  Filled 2023-04-26: qty 3.6

## 2023-05-23 ENCOUNTER — Inpatient Hospital Stay: Attending: Radiation Oncology

## 2023-05-23 VITALS — BP 114/80 | HR 78 | Temp 98.1°F | Resp 14

## 2023-05-23 DIAGNOSIS — Z79899 Other long term (current) drug therapy: Secondary | ICD-10-CM | POA: Insufficient documentation

## 2023-05-23 DIAGNOSIS — Z170 Estrogen receptor positive status [ER+]: Secondary | ICD-10-CM | POA: Insufficient documentation

## 2023-05-23 DIAGNOSIS — Z5111 Encounter for antineoplastic chemotherapy: Secondary | ICD-10-CM | POA: Insufficient documentation

## 2023-05-23 DIAGNOSIS — C50411 Malignant neoplasm of upper-outer quadrant of right female breast: Secondary | ICD-10-CM | POA: Insufficient documentation

## 2023-05-23 MED ORDER — GOSERELIN ACETATE 3.6 MG ~~LOC~~ IMPL
3.6000 mg | DRUG_IMPLANT | Freq: Once | SUBCUTANEOUS | Status: AC
  Administered 2023-05-23: 3.6 mg via SUBCUTANEOUS
  Filled 2023-05-23: qty 3.6

## 2023-05-24 ENCOUNTER — Inpatient Hospital Stay

## 2023-06-20 ENCOUNTER — Telehealth: Payer: Self-pay

## 2023-06-20 NOTE — Telephone Encounter (Signed)
 Left message to confirm appt for 6/20

## 2023-06-21 ENCOUNTER — Inpatient Hospital Stay

## 2023-06-21 ENCOUNTER — Inpatient Hospital Stay: Attending: Radiation Oncology | Admitting: Hematology and Oncology

## 2023-06-21 VITALS — BP 114/70 | HR 84 | Temp 98.3°F | Resp 17 | Wt 125.7 lb

## 2023-06-21 DIAGNOSIS — Z923 Personal history of irradiation: Secondary | ICD-10-CM | POA: Diagnosis not present

## 2023-06-21 DIAGNOSIS — Z1732 Human epidermal growth factor receptor 2 negative status: Secondary | ICD-10-CM | POA: Insufficient documentation

## 2023-06-21 DIAGNOSIS — Z885 Allergy status to narcotic agent status: Secondary | ICD-10-CM | POA: Insufficient documentation

## 2023-06-21 DIAGNOSIS — Z79810 Long term (current) use of selective estrogen receptor modulators (SERMs): Secondary | ICD-10-CM | POA: Insufficient documentation

## 2023-06-21 DIAGNOSIS — Z170 Estrogen receptor positive status [ER+]: Secondary | ICD-10-CM | POA: Diagnosis not present

## 2023-06-21 DIAGNOSIS — Z5111 Encounter for antineoplastic chemotherapy: Secondary | ICD-10-CM | POA: Diagnosis present

## 2023-06-21 DIAGNOSIS — Z833 Family history of diabetes mellitus: Secondary | ICD-10-CM | POA: Insufficient documentation

## 2023-06-21 DIAGNOSIS — G4700 Insomnia, unspecified: Secondary | ICD-10-CM | POA: Insufficient documentation

## 2023-06-21 DIAGNOSIS — Z79899 Other long term (current) drug therapy: Secondary | ICD-10-CM | POA: Diagnosis not present

## 2023-06-21 DIAGNOSIS — R6883 Chills (without fever): Secondary | ICD-10-CM | POA: Insufficient documentation

## 2023-06-21 DIAGNOSIS — F419 Anxiety disorder, unspecified: Secondary | ICD-10-CM | POA: Insufficient documentation

## 2023-06-21 DIAGNOSIS — C50411 Malignant neoplasm of upper-outer quadrant of right female breast: Secondary | ICD-10-CM | POA: Insufficient documentation

## 2023-06-21 DIAGNOSIS — R232 Flushing: Secondary | ICD-10-CM | POA: Insufficient documentation

## 2023-06-21 DIAGNOSIS — Z8042 Family history of malignant neoplasm of prostate: Secondary | ICD-10-CM | POA: Insufficient documentation

## 2023-06-21 DIAGNOSIS — Z8249 Family history of ischemic heart disease and other diseases of the circulatory system: Secondary | ICD-10-CM | POA: Insufficient documentation

## 2023-06-21 DIAGNOSIS — Z801 Family history of malignant neoplasm of trachea, bronchus and lung: Secondary | ICD-10-CM | POA: Insufficient documentation

## 2023-06-21 DIAGNOSIS — Z1721 Progesterone receptor positive status: Secondary | ICD-10-CM | POA: Insufficient documentation

## 2023-06-21 DIAGNOSIS — Z881 Allergy status to other antibiotic agents status: Secondary | ICD-10-CM | POA: Insufficient documentation

## 2023-06-21 DIAGNOSIS — Z800 Family history of malignant neoplasm of digestive organs: Secondary | ICD-10-CM | POA: Insufficient documentation

## 2023-06-21 DIAGNOSIS — Z87891 Personal history of nicotine dependence: Secondary | ICD-10-CM | POA: Insufficient documentation

## 2023-06-21 MED ORDER — GOSERELIN ACETATE 3.6 MG ~~LOC~~ IMPL
3.6000 mg | DRUG_IMPLANT | Freq: Once | SUBCUTANEOUS | Status: AC
  Administered 2023-06-21: 3.6 mg via SUBCUTANEOUS
  Filled 2023-06-21: qty 3.6

## 2023-06-21 NOTE — Progress Notes (Signed)
 [facility] CONSULT NOTE  Patient Care Team: Hellams, Abron Abt as PCP - General (Physician Assistant) Murleen Arms, MD as Consulting Physician (Hematology and Oncology) Colie Dawes, MD as Attending Physician (Radiation Oncology) Sim Dryer, MD as Consulting Physician (General Surgery)  CHIEF COMPLAINTS/PURPOSE OF CONSULTATION:  Newly diagnosed breast cancer  HISTORY OF PRESENTING ILLNESS:  Candace Espinoza 53 y.o. female is here because of recent diagnosis of right breast cancer.   I reviewed her records extensively and collaborated the history with the patient.  SUMMARY OF ONCOLOGIC HISTORY: Oncology History  Malignant neoplasm of upper-outer quadrant of right breast in female, estrogen receptor positive (HCC)  09/12/2022 Mammogram   Screening mammogram showed possible mass in the upper outer right breast measuring about a centimeter at 10:00 4 cm from the nipple, sonographic imaging of the right axilla demonstrates normal lymph nodes.  No enlarged or abnormal lymph nodes.   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   12/04/2022 Cancer Staging   Staging form: Breast, AJCC 8th Edition - Pathologic stage from 12/04/2022: Stage IA (pT1c, pN0, cM0, G3, ER+, PR+, HER2-) - Signed by Murleen Arms, MD on 12/10/2022 Stage prefix: Initial diagnosis Method of lymph node assessment: Axillary lymph node dissection Multigene prognostic tests  performed: Oncotype DX Histologic grading system: 3 grade system   12/31/2022 - 01/29/2023 Radiation Therapy   Plan Name: Breast_R Site: Breast, Right Technique: 3D Mode: Photon Dose Per Fraction: 2.67 Gy Prescribed Dose (Delivered / Prescribed): 40.05 Gy / 40.05 Gy Prescribed Fxs (Delivered / Prescribed): 15 / 15   Plan Name: Breast_R_Bst Site: Breast, Right Technique: Electron Mode: Electron Dose Per Fraction: 2 Gy Prescribed Dose (Delivered / Prescribed): 10 Gy / 10 Gy Prescribed Fxs (Delivered / Prescribed): 5 / 5   02/2023 -  Anti-estrogen oral therapy   Tamoxifen     Discussed the use of AI scribe software for clinical note transcription with the patient, who gave verbal consent to proceed.  History of Present Illness    She is on OFS and tamoxifen  for adj therapy.  MEDICAL HISTORY:  Past Medical History:  Diagnosis Date   Anemia    Vitamin B-12 Deficiency   Anxiety    Esophagitis    GERD (gastroesophageal reflux disease)     SURGICAL HISTORY: Past Surgical History:  Procedure Laterality Date   BREAST BIOPSY Right 09/17/2022   US  RT BREAST BX W LOC DEV 1ST LESION IMG BX SPEC US  GUIDE 09/17/2022 [facility]MO[facility]   BREAST BIOPSY  10/17/2022   MM RT RADIOACTIVE SEED LOC MAMMO GUIDE 10/17/2022 [facility]MO[facility]   BREAST LUMPECTOMY WITH RADIOACTIVE SEED AND SENTINEL LYMPH NODE BIOPSY Right 10/18/2022   Procedure: RIGHT BREAST SEED LUMPECTOMY, RIGHT SENTINEL LYMPH NODE MAPPING;  Surgeon: Sim Dryer, MD;  Location: [facility];  Service: General;  Laterality: Right;  PEC BLOCK   DILITATION & CURRETTAGE/HYSTROSCOPY WITH NOVASURE ABLATION N/A 11/24/2014   Procedure: Hysteroscopy DILATATION & CURETTAGE  WITH Failed  NOVASURE ABLATION;  Surgeon: Marie-Lyne Lavoie, MD;  Location: [facility]  ORS;  Service: Gynecology;  Laterality: N/A;   UPPER GI ENDOSCOPY     WRIST GANGLION EXCISION     Right    SOCIAL HISTORY: Social History   Socioeconomic History    Marital status: Married    Spouse name: Not on file   Number of children: Not on file   Years of education: Not on file   Highest education level: Not on file  Occupational History   Occupation: financial services    Employer: [employer]  Tobacco Use   Smoking status: Former    Current packs/day: 0.10    Average packs/day: 0.1 packs/day for 20.0 years (2.0 ttl pk-yrs)    Types: Cigarettes   Smokeless tobacco: Never  Vaping Use   Vaping status: Never Used  Substance and Sexual Activity   Alcohol use: Yes   Drug use: No   Sexual activity: Yes    Birth control/protection: Injection  Other Topics Concern   Not on file  Social History Narrative   Not on file   Social Drivers of Health   Financial Resource Strain: Low Risk  (03/24/2022)   Received from [facility]   Overall Financial Resource Strain (CARDIA)    Difficulty of Paying Living Expenses: Not hard at all  Food Insecurity: No Food Insecurity (12/04/2022)   Hunger Vital Sign    Worried About Running Out of Food in the Last Year: Never true    Ran Out of Food in the Last Year: Never true  Transportation Needs: No Transportation Needs (12/04/2022)   PRAPARE - Administrator, Civil Service (Medical): No    Lack of Transportation (Non-Medical): No  Physical Activity: Insufficiently Active (03/24/2022)   Received from [facility]   Exercise Vital Sign    On average, how many days per week do you engage in moderate to strenuous exercise (like a brisk walk)?: 2 days    On average, how many minutes do you engage in exercise at this level?: 30 min  Stress: Stress Concern Present (03/24/2022)   Received from [facility] - Occupational Stress Questionnaire    Feeling of Stress : Very much  Social Connections: Socially Integrated (03/24/2022)   Received from [facility]   Social Network    How would you rate your social network (family, work, friends)?: Good  participation with social networks  Intimate Partner Violence: Not At Risk (12/04/2022)   Humiliation, Afraid, Rape, and Kick questionnaire    Fear of Current or Ex-Partner: No    Emotionally Abused: No    Physically Abused: No    Sexually Abused: No    FAMILY HISTORY: Family History  Problem Relation Age of Onset   Prostate cancer Father 73   Stomach cancer Father 36   Lung cancer Maternal Grandfather        dx >50   Lung cancer Paternal Grandmother        dx > 50; smoking hx   Prostate cancer Paternal Grandfather        dx 78s   Diabetes Other        1st degree relative   Hypertension Other     ALLERGIES:  is allergic to codeine and doxycycline .  MEDICATIONS:  Current Outpatient Medications  Medication Sig Dispense Refill   albuterol (VENTOLIN HFA) 108 (90 Base) MCG/ACT inhaler      ALPRAZolam  (XANAX ) 0.25 MG tablet Take 1 tablet (0.25 mg total) by mouth 3 (three)  times daily as needed for anxiety. 30 tablet 2   Calcium-Magnesium-Vitamin D  (CALCIUM MAGNESIUM PO) Take 2 tablets by mouth daily. (Patient not taking: Reported on 12/04/2022)     Cholecalciferol (VITAMIN D  PO) Take 1 tablet by mouth daily. (Patient not taking: Reported on 12/04/2022)     Ciclopirox  1 % shampoo Massage into scalp and let sit 3 min then rinse--- use 2x a week x 4 weeks (Patient not taking: Reported on 12/04/2022) 120 mL 1   cyclobenzaprine  (FLEXERIL ) 10 MG tablet Take 1 tablet (10 mg total) by mouth 3 (three) times daily as needed for muscle spasms. 30 tablet 0   ibuprofen (ADVIL) 800 MG tablet Take 800 mg by mouth.     meclizine  (ANTIVERT ) 25 MG tablet Take 1 tablet (25 mg total) by mouth 3 (three) times daily as needed for dizziness. 30 tablet 0   oxyCODONE  (OXY IR/ROXICODONE ) 5 MG immediate release tablet Take 1 tablet (5 mg total) by mouth every 6 (six) hours as needed for severe pain (pain score 7-10). (Patient not taking: Reported on 03/29/2023) 15 tablet 0   Probiotic Product (PROBIOTIC PO) Take 1  capsule by mouth daily.  (Patient not taking: Reported on 12/04/2022)     tamoxifen  (NOLVADEX ) 20 MG tablet Take 1 tablet (20 mg total) by mouth daily. 90 tablet 3   vitamin B-12 (CYANOCOBALAMIN ) 100 MCG tablet Take 100 mcg by mouth daily.     No current facility-administered medications for this visit.    REVIEW OF SYSTEMS:   Constitutional: Denies fevers, chills or abnormal night sweats Eyes: Denies blurriness of vision, double vision or watery eyes Ears, nose, mouth, throat, and face: Denies mucositis or sore throat Respiratory: Denies cough, dyspnea or wheezes Cardiovascular: Denies palpitation, chest discomfort or lower extremity swelling Gastrointestinal:  Denies nausea, heartburn or change in bowel habits Skin: Denies abnormal skin rashes Lymphatics: Denies new lymphadenopathy or easy bruising Neurological:Denies numbness, tingling or new weaknesses Behavioral/Psych: Mood is stable, no new changes  Breast: Denies any palpable lumps or discharge All other systems were reviewed with the patient and are negative.  PHYSICAL EXAMINATION: ECOG PERFORMANCE STATUS: 0 - Asymptomatic  Vitals:   06/21/23 0837  BP: 114/70  Pulse: 84  Resp: 17  Temp: 98.3 F (36.8 C)  SpO2: 100%    Filed Weights   06/21/23 0837  Weight: 125 lb 11.2 oz (57 kg)     GENERAL:alert, no distress and comfortable  LABORATORY DATA:  I have reviewed the data as listed Lab Results  Component Value Date   WBC 3.6 (L) 03/29/2023   HGB 12.2 03/29/2023   HCT 35.9 (L) 03/29/2023   MCV 91.3 03/29/2023   PLT 311 03/29/2023   Lab Results  Component Value Date   NA 139 03/29/2023   K 4.3 03/29/2023   CL 104 03/29/2023   CO2 28 03/29/2023    RADIOGRAPHIC STUDIES: I have personally reviewed the radiological reports and agreed with the findings in the report.  ASSESSMENT AND PLAN:  No problem-specific Assessment & Plan notes found for this encounter.     All questions were answered. The patient  knows to call the clinic with any problems, questions or concerns.    Murleen Arms, MD 06/21/23

## 2023-06-21 NOTE — Assessment & Plan Note (Signed)
 This is a very pleasant 53 year old premenopausal female patient with newly diagnosed right breast grade 3 IDC, ER positive PR positive HER2 nonamplified, Ki-67 of 30% status post lumpectomy, Oncotype DX of 27 referred to medical oncology for additional recommendations.  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 HER2 negative. Oncotype DX score of 27, about 16% risk of distant recurrence at 9 years without chemotherapy.  We do agree with the chemotherapy recommendations with [facility] team, I did recommend docetaxel and cyclophosphamide every 21 days for 4 cycles Discussed the benefits and risks of chemotherapy, radiation, and anti-estrogen therapy. -Recommend chemotherapy with Docetaxel and Cyclophosphamide every 21 days for 4 cycles. After much consideration, pt wanted to omit chemotherapy. She completed adjuvant radiation and is currently on OFS with tamoxifen .  Assessment and Plan Assessment & Plan Hot flashes Hot flashes likely exacerbated by tamoxifen  and goserelin therapy, causing nocturnal symptoms and sleep disturbances. - If she has worsening hot flashes, we can consider effexor.  If - Continue tamoxifen  and goserelin therapy.  Insomnia Insomnia likely due to anxiety and hot flashes, characterized by difficulty staying asleep. - Encourage meditation. - Advise on meditation apps to manage a racing mind.  Anxiety Anxiety managed with Xanax . - Continue Xanax . - Meet with doctor next week for Xanax  refill discussion.

## 2023-06-21 NOTE — Progress Notes (Signed)
 BRIEF ONCOLOGIC HISTORY:  Oncology History  Malignant neoplasm of upper-outer quadrant of right breast in female, estrogen receptor positive (HCC)  09/12/2022 Mammogram   Screening mammogram showed possible mass in the upper outer right breast measuring about a centimeter at 10:00 4 cm from the nipple, sonographic imaging of the right axilla demonstrates normal lymph nodes.  No enlarged or abnormal lymph nodes.   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   12/04/2022 Cancer Staging   Staging form: Breast, AJCC 8th Edition - Pathologic stage from 12/04/2022: Stage IA (pT1c, pN0, cM0, G3, ER+, PR+, HER2-) - Signed by Murleen Arms, MD on 12/10/2022 Stage prefix: Initial diagnosis Method of lymph node assessment: Axillary lymph node dissection Multigene prognostic tests performed: Oncotype DX Histologic grading system: 3 grade system   12/31/2022 - 01/29/2023 Radiation Therapy   Plan Name: Breast_R Site: Breast, Right Technique: 3D Mode: Photon Dose Per Fraction: 2.67 Gy Prescribed Dose (Delivered / Prescribed): 40.05 Gy / 40.05 Gy Prescribed Fxs (Delivered / Prescribed): 15 / 15   Plan Name: Breast_R_Bst Site: Breast, Right Technique: Electron Mode: Electron Dose Per Fraction: 2 Gy Prescribed Dose (Delivered / Prescribed): 10 Gy / 10 Gy Prescribed Fxs (Delivered / Prescribed): 5 / 5   02/2023 -  Anti-estrogen oral therapy   Tamoxifen      INTERVAL HISTORY:   Discussed the use of AI scribe software for clinical note transcription with the patient, who gave verbal consent to proceed.  History of Present Illness Candace Espinoza is a 53 year old female with breast cancer who presents for follow-up regarding her treatment with tamoxifen  and Zoladex .  She is undergoing treatment for breast cancer with tamoxifen  and Zoladex . She experiences hot flashes, described as 'cold chills and hot chills and hot all the time,' which worsen at night and disrupt her sleep, causing her to wake up and remove covers due to feeling hot. No changes in breast tissue, attributing any sensations to scar tissue, and she is not menstruating due to Zoladex  injections.  She receives monthly Zoladex  injections and has a scheduled appointment for her next shot. She mentions soreness under her arm, attributed to increased physical activity, including playing pickleball and strength training several times a week.  She takes Xanax  for anxiety and plans to meet with her doctor next week to discuss a refill. No new medications, and her pharmacy automatically refills her tamoxifen  prescription.  She reports significant sleep issues, getting about three hours of sleep per night due to a 'racing mind.' She finds it difficult to fall back asleep once awake.   REVIEW OF SYSTEMS:  Review of Systems  Constitutional:  Negative for appetite change, chills, fatigue, fever and unexpected weight change.  HENT:   Negative for hearing loss, lump/mass and trouble swallowing.   Eyes:  Negative for eye problems and icterus.  Respiratory:  Negative for chest tightness, cough and shortness of breath.   Cardiovascular:  Negative for chest pain, leg swelling and palpitations.  Gastrointestinal:  Negative for abdominal distention, abdominal pain,  constipation, diarrhea, nausea and vomiting.  Endocrine: Positive for hot flashes.  Genitourinary:  Negative for difficulty urinating.   Musculoskeletal:   Negative for arthralgias.  Skin:  Negative for itching and rash.  Neurological:  Negative for dizziness, extremity weakness, headaches and numbness.  Hematological:  Negative for adenopathy. Does not bruise/bleed easily.  Psychiatric/Behavioral:  Negative for depression. The patient is not nervous/anxious.    Breast: Denies any new nodularity, masses, tenderness, nipple changes, or nipple discharge.       PAST MEDICAL/SURGICAL HISTORY:  Past Medical History:  Diagnosis Date   Anemia    Vitamin B-12 Deficiency   Anxiety    Esophagitis    GERD (gastroesophageal reflux disease)    Past Surgical History:  Procedure Laterality Date   BREAST BIOPSY Right 09/17/2022   US  RT BREAST BX W LOC DEV 1ST LESION IMG BX SPEC US  GUIDE 09/17/2022 [facility]   BREAST BIOPSY  10/17/2022   MM RT RADIOACTIVE SEED LOC MAMMO GUIDE 10/17/2022 [facility]   BREAST LUMPECTOMY WITH RADIOACTIVE SEED AND SENTINEL LYMPH NODE BIOPSY Right 10/18/2022   Procedure: RIGHT BREAST SEED LUMPECTOMY, RIGHT SENTINEL LYMPH NODE MAPPING;  Surgeon: Sim Dryer, MD;  Location: [facility];  Service: General;  Laterality: Right;  PEC BLOCK   DILITATION & CURRETTAGE/HYSTROSCOPY WITH NOVASURE ABLATION N/A 11/24/2014   Procedure: Hysteroscopy DILATATION & CURETTAGE  WITH Failed  NOVASURE ABLATION;  Surgeon: Percy Bracken, MD;  Location: [facility];  Service: Gynecology;  Laterality: N/A;   UPPER GI ENDOSCOPY     WRIST GANGLION EXCISION     Right     ALLERGIES:  Allergies  Allergen Reactions   Codeine Itching   Doxycycline  Other (See Comments)    Caused ulcers     CURRENT MEDICATIONS:  Outpatient Encounter Medications as of 06/21/2023  Medication Sig Note   albuterol (VENTOLIN HFA) 108 (90 Base) MCG/ACT inhaler     ALPRAZolam  (XANAX ) 0.25 MG tablet Take 1 tablet (0.25 mg total) by mouth 3 (three) times daily as needed for anxiety.    cyclobenzaprine  (FLEXERIL ) 10 MG tablet Take 1 tablet  (10 mg total) by mouth 3 (three) times daily as needed for muscle spasms.    ibuprofen (ADVIL) 800 MG tablet Take 800 mg by mouth.    meclizine  (ANTIVERT ) 25 MG tablet Take 1 tablet (25 mg total) by mouth 3 (three) times daily as needed for dizziness.    tamoxifen  (NOLVADEX ) 20 MG tablet Take 1 tablet (20 mg total) by mouth daily.    vitamin B-12 (CYANOCOBALAMIN ) 100 MCG tablet Take 100 mcg by mouth daily.    [DISCONTINUED] Calcium-Magnesium-Vitamin D  (CALCIUM MAGNESIUM PO) Take 2 tablets by mouth daily. (Patient not taking: Reported on 12/04/2022)    [DISCONTINUED] Cholecalciferol (VITAMIN D  PO) Take 1 tablet by mouth daily. (Patient not taking: Reported on 12/04/2022)    [DISCONTINUED] Ciclopirox  1 % shampoo Massage into scalp and let sit 3 min then rinse--- use 2x a week x 4 weeks (Patient not taking: Reported on 12/04/2022) 10/18/2022: More than a year   [DISCONTINUED] oxyCODONE  (OXY IR/ROXICODONE ) 5 MG immediate release tablet Take 1 tablet (5 mg total) by mouth every 6 (six) hours as needed for severe pain (pain score 7-10). (Patient not taking: Reported on 03/29/2023)    [DISCONTINUED] Probiotic Product (PROBIOTIC PO) Take 1 capsule by mouth daily.  (Patient not taking: Reported on 12/04/2022)    No facility-administered encounter medications on file as of 06/21/2023.     ONCOLOGIC FAMILY HISTORY:  Family History  Problem Relation Age of Onset   Prostate cancer Father 43   Stomach cancer Father 54   Lung cancer Maternal Grandfather        dx >50   Lung cancer Paternal Grandmother        dx > 50; smoking hx   Prostate cancer Paternal Grandfather        dx 72s   Diabetes Other        1st degree relative   Hypertension Other      SOCIAL HISTORY:  Social History   Socioeconomic History   Marital status: Married    Spouse name: Not on file   Number of children: Not on file   Years of education: Not on file   Highest education level: Not on file  Occupational History    Occupation: financial services    Employer: [employer]  Tobacco Use   Smoking status: Former    Current packs/day: 0.10    Average packs/day: 0.1 packs/day for 20.0 years (2.0 ttl pk-yrs)    Types: Cigarettes   Smokeless tobacco: Never  Vaping Use   Vaping status: Never Used  Substance and Sexual Activity   Alcohol use: Yes   Drug use: No   Sexual activity: Yes    Birth control/protection: Injection  Other Topics Concern   Not on file  Social History Narrative   Not on file   Social Drivers of Health   Financial Resource Strain: Low Risk  (03/24/2022)   Received from [facility]   Overall Financial Resource Strain (CARDIA)    Difficulty of Paying Living Expenses: Not hard at all  Food Insecurity: No Food Insecurity (12/04/2022)   Hunger Vital Sign    Worried About Running Out of Food in the Last Year: Never true    Ran Out of Food in the Last Year: Never true  Transportation Needs: No Transportation Needs (12/04/2022)   PRAPARE - Administrator, Civil Service (Medical): No    Lack of Transportation (Non-Medical): No  Physical Activity: Insufficiently Active (03/24/2022)   Received from [facility]   Exercise Vital Sign    On average, how many days per week do you engage in moderate to strenuous exercise (like a brisk walk)?: 2 days    On average, how many minutes do you engage in exercise at this level?: 30 min  Stress: Stress Concern Present (03/24/2022)   Received from [facility] - Occupational Stress Questionnaire    Feeling of Stress : Very much  Social Connections: Socially Integrated (03/24/2022)   Received from [facility]   Social Network    How would you rate your social network (family, work, friends)?: Good participation with social networks  Intimate Partner Violence: Not At Risk (12/04/2022)   Humiliation, Afraid, Rape, and Kick questionnaire    Fear of Current or Ex-Partner: No     Emotionally Abused: No    Physically Abused: No    Sexually Abused: No     OBSERVATIONS/OBJECTIVE:  BP 114/70 (BP Location: Left Arm, Patient Position: Sitting)   Pulse 84   Temp 98.3 F (36.8 C) (Temporal)   Resp 17   Wt 125 lb 11.2 oz (57 kg)   SpO2 100%   BMI 22.99 kg/m  GENERAL: Patient is a well appearing female in no acute distress HEENT:  Sclerae anicteric.  Oropharynx clear and moist. No ulcerations or evidence of oropharyngeal candidiasis. Neck is  supple.  NODES:  No cervical, supraclavicular, or axillary lymphadenopathy palpated.  BREAST EXAM:  Right breast s/p lumpectomy and radiation, no sign of local recurrence, left breast benign LUNGS:  Clear to auscultation bilaterally.  No wheezes or rhonchi. HEART:  Regular rate and rhythm. No murmur appreciated. ABDOMEN:  Soft, nontender.  Positive, normoactive bowel sounds. No organomegaly palpated. MSK:  No focal spinal tenderness to palpation. Full range of motion bilaterally in the upper extremities. EXTREMITIES:  No peripheral edema.   SKIN:  Clear with no obvious rashes or skin changes. No nail dyscrasia. NEURO:  Nonfocal. Well oriented.  Appropriate affect.   LABORATORY DATA:  None for this visit.  DIAGNOSTIC IMAGING:  None for this visit.   Malignant neoplasm of upper-outer quadrant of right breast in female, estrogen receptor positive (HCC) This is a very pleasant 53 year old premenopausal female patient with newly diagnosed right breast grade 3 IDC, ER positive PR positive HER2 nonamplified, Ki-67 of 30% status post lumpectomy, Oncotype DX of 27 referred to medical oncology for additional recommendations.  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 HER2 negative. Oncotype DX score of 27, about 16% risk of distant recurrence at 9 years without chemotherapy.  We do agree with the chemotherapy  recommendations with [facility] team, I did recommend docetaxel and cyclophosphamide every 21 days for 4 cycles Discussed the benefits and risks of chemotherapy, radiation, and anti-estrogen therapy. -Recommend chemotherapy with Docetaxel and Cyclophosphamide every 21 days for 4 cycles. After much consideration, pt wanted to omit chemotherapy. She completed adjuvant radiation and is currently on OFS with tamoxifen .  Assessment and Plan Assessment & Plan Hot flashes Hot flashes likely exacerbated by tamoxifen  and goserelin therapy, causing nocturnal symptoms and sleep disturbances. - If she has worsening hot flashes, we can consider effexor.  If - Continue tamoxifen  and goserelin therapy.  Insomnia Insomnia likely due to anxiety and hot flashes, characterized by difficulty staying asleep. - Encourage meditation. - Advise on meditation apps to manage a racing mind.  Anxiety Anxiety managed with Xanax . - Continue Xanax . - Meet with doctor next week for Xanax  refill discussion.      The patient was provided an opportunity to ask questions and all were answered. The patient agreed with the plan and demonstrated an understanding of the instructions.   Total encounter time:30 minutes*in face-to-face visit time, chart review, lab review, care coordination, order entry, and documentation of the encounter time.   *Total Encounter Time as defined by the Centers for Medicare and Medicaid Services includes, in addition to the face-to-face time of a patient visit (documented in the note above) non-face-to-face time: obtaining and reviewing outside history, ordering and reviewing medications, tests or procedures, care coordination (communications with other health care professionals or caregivers) and documentation in the medical record.

## 2023-07-19 ENCOUNTER — Inpatient Hospital Stay: Attending: Radiation Oncology

## 2023-07-19 VITALS — BP 120/89 | HR 66 | Resp 16

## 2023-07-19 DIAGNOSIS — C50411 Malignant neoplasm of upper-outer quadrant of right female breast: Secondary | ICD-10-CM | POA: Insufficient documentation

## 2023-07-19 DIAGNOSIS — Z170 Estrogen receptor positive status [ER+]: Secondary | ICD-10-CM | POA: Diagnosis not present

## 2023-07-19 DIAGNOSIS — Z1732 Human epidermal growth factor receptor 2 negative status: Secondary | ICD-10-CM | POA: Insufficient documentation

## 2023-07-19 DIAGNOSIS — Z79899 Other long term (current) drug therapy: Secondary | ICD-10-CM | POA: Diagnosis not present

## 2023-07-19 DIAGNOSIS — Z5111 Encounter for antineoplastic chemotherapy: Secondary | ICD-10-CM | POA: Diagnosis present

## 2023-07-19 DIAGNOSIS — Z1721 Progesterone receptor positive status: Secondary | ICD-10-CM | POA: Diagnosis not present

## 2023-07-19 MED ORDER — GOSERELIN ACETATE 3.6 MG ~~LOC~~ IMPL
3.6000 mg | DRUG_IMPLANT | Freq: Once | SUBCUTANEOUS | Status: AC
Start: 2023-07-19 — End: 2023-07-19
  Administered 2023-07-19: 3.6 mg via SUBCUTANEOUS
  Filled 2023-07-19: qty 3.6

## 2023-08-16 ENCOUNTER — Inpatient Hospital Stay: Attending: Radiation Oncology

## 2023-08-16 VITALS — BP 109/80 | HR 67 | Temp 98.2°F | Resp 18

## 2023-08-16 DIAGNOSIS — Z170 Estrogen receptor positive status [ER+]: Secondary | ICD-10-CM | POA: Diagnosis not present

## 2023-08-16 DIAGNOSIS — C50411 Malignant neoplasm of upper-outer quadrant of right female breast: Secondary | ICD-10-CM | POA: Insufficient documentation

## 2023-08-16 DIAGNOSIS — Z5111 Encounter for antineoplastic chemotherapy: Secondary | ICD-10-CM | POA: Insufficient documentation

## 2023-08-16 DIAGNOSIS — Z79899 Other long term (current) drug therapy: Secondary | ICD-10-CM | POA: Diagnosis not present

## 2023-08-16 MED ORDER — GOSERELIN ACETATE 3.6 MG ~~LOC~~ IMPL
3.6000 mg | DRUG_IMPLANT | Freq: Once | SUBCUTANEOUS | Status: AC
Start: 2023-08-16 — End: 2023-08-16
  Administered 2023-08-16: 3.6 mg via SUBCUTANEOUS
  Filled 2023-08-16: qty 3.6

## 2023-08-22 ENCOUNTER — Encounter: Payer: Self-pay | Admitting: *Deleted

## 2023-08-22 NOTE — Progress Notes (Signed)
 Received message from [facility] team stating pt wishes to cancel testing at this time.  Testing has been canceled.  We can re-order if pt wishes to proceed in the future.

## 2023-09-13 ENCOUNTER — Inpatient Hospital Stay: Attending: Radiation Oncology

## 2023-09-13 VITALS — BP 107/83 | HR 79 | Temp 98.4°F | Resp 15

## 2023-09-13 DIAGNOSIS — Z79810 Long term (current) use of selective estrogen receptor modulators (SERMs): Secondary | ICD-10-CM | POA: Insufficient documentation

## 2023-09-13 DIAGNOSIS — Z1732 Human epidermal growth factor receptor 2 negative status: Secondary | ICD-10-CM | POA: Insufficient documentation

## 2023-09-13 DIAGNOSIS — Z170 Estrogen receptor positive status [ER+]: Secondary | ICD-10-CM | POA: Insufficient documentation

## 2023-09-13 DIAGNOSIS — Z1721 Progesterone receptor positive status: Secondary | ICD-10-CM | POA: Insufficient documentation

## 2023-09-13 DIAGNOSIS — Z5111 Encounter for antineoplastic chemotherapy: Secondary | ICD-10-CM | POA: Diagnosis present

## 2023-09-13 DIAGNOSIS — Z923 Personal history of irradiation: Secondary | ICD-10-CM | POA: Insufficient documentation

## 2023-09-13 DIAGNOSIS — C50411 Malignant neoplasm of upper-outer quadrant of right female breast: Secondary | ICD-10-CM | POA: Diagnosis present

## 2023-09-13 MED ORDER — GOSERELIN ACETATE 3.6 MG ~~LOC~~ IMPL
3.6000 mg | DRUG_IMPLANT | Freq: Once | SUBCUTANEOUS | Status: AC
  Administered 2023-09-13: 3.6 mg via SUBCUTANEOUS
  Filled 2023-09-13: qty 3.6

## 2023-09-16 ENCOUNTER — Ambulatory Visit
Admission: RE | Admit: 2023-09-16 | Discharge: 2023-09-16 | Disposition: A | Discharge status: Home / Self Care | Source: Ambulatory Visit | Attending: Adult Health | Admitting: Adult Health

## 2023-09-16 DIAGNOSIS — Z170 Estrogen receptor positive status [ER+]: Secondary | ICD-10-CM

## 2023-09-16 HISTORY — DX: Personal history of irradiation: Z92.3

## 2023-09-16 HISTORY — DX: Malignant neoplasm of unspecified site of unspecified female breast: C50.919

## 2023-10-11 ENCOUNTER — Inpatient Hospital Stay: Attending: Radiation Oncology

## 2023-10-11 VITALS — BP 116/75 | HR 75 | Temp 98.1°F | Resp 18

## 2023-10-11 DIAGNOSIS — Z170 Estrogen receptor positive status [ER+]: Secondary | ICD-10-CM | POA: Insufficient documentation

## 2023-10-11 DIAGNOSIS — Z8042 Family history of malignant neoplasm of prostate: Secondary | ICD-10-CM | POA: Diagnosis not present

## 2023-10-11 DIAGNOSIS — Z79899 Other long term (current) drug therapy: Secondary | ICD-10-CM | POA: Diagnosis not present

## 2023-10-11 DIAGNOSIS — Z800 Family history of malignant neoplasm of digestive organs: Secondary | ICD-10-CM | POA: Diagnosis not present

## 2023-10-11 DIAGNOSIS — Z833 Family history of diabetes mellitus: Secondary | ICD-10-CM | POA: Insufficient documentation

## 2023-10-11 DIAGNOSIS — Z8249 Family history of ischemic heart disease and other diseases of the circulatory system: Secondary | ICD-10-CM | POA: Diagnosis not present

## 2023-10-11 DIAGNOSIS — Z5111 Encounter for antineoplastic chemotherapy: Secondary | ICD-10-CM | POA: Diagnosis present

## 2023-10-11 DIAGNOSIS — C50411 Malignant neoplasm of upper-outer quadrant of right female breast: Secondary | ICD-10-CM | POA: Insufficient documentation

## 2023-10-11 DIAGNOSIS — Z801 Family history of malignant neoplasm of trachea, bronchus and lung: Secondary | ICD-10-CM | POA: Diagnosis not present

## 2023-10-11 MED ORDER — GOSERELIN ACETATE 3.6 MG ~~LOC~~ IMPL
3.6000 mg | DRUG_IMPLANT | Freq: Once | SUBCUTANEOUS | Status: AC
  Administered 2023-10-11: 3.6 mg via SUBCUTANEOUS
  Filled 2023-10-11: qty 3.6

## 2023-10-15 ENCOUNTER — Encounter: Payer: Self-pay | Admitting: Hematology and Oncology

## 2023-11-08 ENCOUNTER — Inpatient Hospital Stay: Attending: Radiation Oncology

## 2023-11-08 VITALS — BP 110/83 | HR 74 | Temp 98.0°F | Resp 17

## 2023-11-08 DIAGNOSIS — Z170 Estrogen receptor positive status [ER+]: Secondary | ICD-10-CM | POA: Diagnosis not present

## 2023-11-08 DIAGNOSIS — C50411 Malignant neoplasm of upper-outer quadrant of right female breast: Secondary | ICD-10-CM | POA: Diagnosis present

## 2023-11-08 DIAGNOSIS — Z79899 Other long term (current) drug therapy: Secondary | ICD-10-CM | POA: Insufficient documentation

## 2023-11-08 DIAGNOSIS — Z5111 Encounter for antineoplastic chemotherapy: Secondary | ICD-10-CM | POA: Diagnosis present

## 2023-11-08 MED ORDER — GOSERELIN ACETATE 3.6 MG ~~LOC~~ IMPL
3.6000 mg | DRUG_IMPLANT | Freq: Once | SUBCUTANEOUS | Status: AC
  Administered 2023-11-08: 3.6 mg via SUBCUTANEOUS
  Filled 2023-11-08: qty 3.6

## 2023-12-06 ENCOUNTER — Inpatient Hospital Stay (HOSPITAL_BASED_OUTPATIENT_CLINIC_OR_DEPARTMENT_OTHER): Admitting: Hematology and Oncology

## 2023-12-06 ENCOUNTER — Inpatient Hospital Stay: Attending: Radiation Oncology

## 2023-12-06 VITALS — BP 118/76 | HR 84 | Temp 98.2°F | Resp 18 | Wt 129.7 lb

## 2023-12-06 VITALS — BP 116/76 | HR 81 | Resp 17

## 2023-12-06 DIAGNOSIS — F5104 Psychophysiologic insomnia: Secondary | ICD-10-CM | POA: Diagnosis not present

## 2023-12-06 DIAGNOSIS — C50411 Malignant neoplasm of upper-outer quadrant of right female breast: Secondary | ICD-10-CM | POA: Diagnosis present

## 2023-12-06 DIAGNOSIS — R5383 Other fatigue: Secondary | ICD-10-CM | POA: Diagnosis not present

## 2023-12-06 DIAGNOSIS — F419 Anxiety disorder, unspecified: Secondary | ICD-10-CM | POA: Insufficient documentation

## 2023-12-06 DIAGNOSIS — Z833 Family history of diabetes mellitus: Secondary | ICD-10-CM | POA: Insufficient documentation

## 2023-12-06 DIAGNOSIS — Z87891 Personal history of nicotine dependence: Secondary | ICD-10-CM | POA: Insufficient documentation

## 2023-12-06 DIAGNOSIS — Z79899 Other long term (current) drug therapy: Secondary | ICD-10-CM | POA: Insufficient documentation

## 2023-12-06 DIAGNOSIS — N644 Mastodynia: Secondary | ICD-10-CM | POA: Diagnosis not present

## 2023-12-06 DIAGNOSIS — Z800 Family history of malignant neoplasm of digestive organs: Secondary | ICD-10-CM | POA: Insufficient documentation

## 2023-12-06 DIAGNOSIS — Z923 Personal history of irradiation: Secondary | ICD-10-CM | POA: Diagnosis not present

## 2023-12-06 DIAGNOSIS — G4709 Other insomnia: Secondary | ICD-10-CM | POA: Diagnosis not present

## 2023-12-06 DIAGNOSIS — Z801 Family history of malignant neoplasm of trachea, bronchus and lung: Secondary | ICD-10-CM | POA: Insufficient documentation

## 2023-12-06 DIAGNOSIS — Z5111 Encounter for antineoplastic chemotherapy: Secondary | ICD-10-CM | POA: Insufficient documentation

## 2023-12-06 DIAGNOSIS — Z885 Allergy status to narcotic agent status: Secondary | ICD-10-CM | POA: Insufficient documentation

## 2023-12-06 DIAGNOSIS — K209 Esophagitis, unspecified without bleeding: Secondary | ICD-10-CM | POA: Insufficient documentation

## 2023-12-06 DIAGNOSIS — Z170 Estrogen receptor positive status [ER+]: Secondary | ICD-10-CM | POA: Insufficient documentation

## 2023-12-06 DIAGNOSIS — Z79810 Long term (current) use of selective estrogen receptor modulators (SERMs): Secondary | ICD-10-CM | POA: Insufficient documentation

## 2023-12-06 DIAGNOSIS — N951 Menopausal and female climacteric states: Secondary | ICD-10-CM | POA: Insufficient documentation

## 2023-12-06 DIAGNOSIS — Z881 Allergy status to other antibiotic agents status: Secondary | ICD-10-CM | POA: Insufficient documentation

## 2023-12-06 DIAGNOSIS — Z17411 Hormone receptor positive with human epidermal growth factor receptor 2 negative status: Secondary | ICD-10-CM | POA: Insufficient documentation

## 2023-12-06 DIAGNOSIS — R232 Flushing: Secondary | ICD-10-CM | POA: Diagnosis not present

## 2023-12-06 DIAGNOSIS — L819 Disorder of pigmentation, unspecified: Secondary | ICD-10-CM | POA: Diagnosis not present

## 2023-12-06 MED ORDER — GOSERELIN ACETATE 3.6 MG ~~LOC~~ IMPL
3.6000 mg | DRUG_IMPLANT | Freq: Once | SUBCUTANEOUS | Status: AC
  Administered 2023-12-06: 3.6 mg via SUBCUTANEOUS
  Filled 2023-12-06: qty 3.6

## 2023-12-06 MED ORDER — GABAPENTIN 300 MG PO CAPS: 300.0000 mg | ORAL_CAPSULE | Freq: Every day | ORAL | 1 refills | Status: AC

## 2023-12-06 NOTE — Assessment & Plan Note (Signed)
 This is a very pleasant 53 year old premenopausal female patient with newly diagnosed right breast grade 3 IDC, ER positive PR positive HER2 nonamplified, Ki-67 of 30% status post lumpectomy, Oncotype DX of 27 referred to medical oncology for additional recommendations.  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 HER2 negative. Oncotype DX score of 27, about 16% risk of distant recurrence at 9 years without chemotherapy.  We do agree with the chemotherapy recommendations with [facility] team, I did recommend docetaxel and cyclophosphamide every 21 days for 4 cycles Discussed the benefits and risks of chemotherapy, radiation, and anti-estrogen therapy. Recommend chemotherapy with Docetaxel and Cyclophosphamide every 21 days for 4 cycles. After much consideration, pt wanted to omit chemotherapy. She completed adjuvant radiation and is currently on OFS with tamoxifen .  Assessment and Plan Assessment & Plan Insomnia and fatigue associated with menopausal vasomotor symptoms Persistent insomnia and fatigue likely exacerbated by menopausal vasomotor symptoms. Gabapentin  may help with vasomotor symptoms and improve sleep. Discussed potential side effects, primarily sleepiness. - Prescribed gabapentin  at night for vasomotor symptoms and sleep improvement. - Advised trial of gabapentin  for at least two weeks before discontinuing. - Recommended meditation using the Calm app to aid sleep.  Post-surgical right breast pain Intermittent shooting pains likely due to nerve regeneration post-surgery. Mammogram results normal.  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 Gabapentin  may provide some relief. - Refer to physical therapy for specific exercises. She says she can't make it to the physical therapy at this time  - She will let us   know when she is ready to go to PT  Melasma (facial hyperpigmentation) Facial hyperpigmentation likely due to hormonal changes associated with menopause.

## 2023-12-06 NOTE — Progress Notes (Signed)
 BRIEF ONCOLOGIC HISTORY:  Oncology History  Malignant neoplasm of upper-outer quadrant of right breast in female, estrogen receptor positive (HCC)  09/12/2022 Mammogram   Screening mammogram showed possible mass in the upper outer right breast measuring about a centimeter at 10:00 4 cm from the nipple, sonographic imaging of the right axilla demonstrates normal lymph nodes.  No enlarged or abnormal lymph nodes.   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   12/04/2022 Cancer Staging   Staging form: Breast, AJCC 8th Edition - Pathologic stage from 12/04/2022: Stage IA (pT1c, pN0, cM0, G3, ER+, PR+, HER2-) - Signed by Loretha Ash, MD on 12/10/2022 Stage prefix: Initial diagnosis Method of lymph node assessment: Axillary lymph node dissection Multigene prognostic tests performed: Oncotype DX Histologic grading system: 3 grade system   12/31/2022 - 01/29/2023 Radiation Therapy   Plan Name: Breast_R Site: Breast, Right Technique: 3D Mode: Photon Dose Per Fraction: 2.67 Gy Prescribed Dose (Delivered / Prescribed): 40.05 Gy / 40.05 Gy Prescribed Fxs (Delivered / Prescribed): 15 / 15   Plan Name: Breast_R_Bst Site: Breast, Right Technique: Electron Mode: Electron Dose Per Fraction: 2 Gy Prescribed Dose (Delivered / Prescribed): 10 Gy / 10 Gy Prescribed Fxs (Delivered / Prescribed): 5 / 5   02/2023 -  Anti-estrogen oral therapy   Tamoxifen      INTERVAL HISTORY:   Discussed the use of AI scribe software for clinical note transcription with the patient, who gave verbal consent to proceed.  History of Present Illness Candace Espinoza is a 53 year old female who presents with fatigue and insomnia.  She experiences significant fatigue, which she attributes to work-related stress. 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 Despite working only three days in the office, the demands of her role contribute to her tiredness.  She has chronic insomnia, characterized by difficulty staying asleep. She often wakes up after a few hours of sleep, sometimes needing to use the bathroom, but is frequently unable to return to sleep due to a racing mind. She has tried various sleep aids, including melatonin, Xanax , Z-Quil, Nyquil, and Unisom, without success. She has also experimented with a natural supplement called Ryze, which did not help. She has not tried meditation.  Persistent hot flashes contribute to her sleep disturbances. She has not found relief from these symptoms with her current regimen.  She reports new onset of shooting pains in her breast over the past 30 to 45 days. She had breast surgery less than a year ago.  She also reports a recurrence of cording in her arm, which had previously resolved. Despite being active and performing recommended exercises, the cording has returned and is causing discomfort. She has been receiving monthly massages, which she believes may have exacerbated the issue.  Rest of the pertinent 10 point ROS reviewed and neg.    PAST MEDICAL/SURGICAL HISTORY:  Past Medical History:  Diagnosis Date   Anemia    Vitamin B-12 Deficiency   Anxiety  Breast cancer (HCC)    Esophagitis    GERD (gastroesophageal reflux disease)    Personal history of radiation therapy    Past Surgical History:  Procedure Laterality Date   BREAST  BIOPSY Right 09/17/2022   US  RT BREAST BX W LOC DEV 1ST LESION IMG BX SPEC US  GUIDE 09/17/2022 [facility]   BREAST BIOPSY  10/17/2022   MM RT RADIOACTIVE SEED LOC MAMMO GUIDE 10/17/2022 [facility]   BREAST LUMPECTOMY WITH RADIOACTIVE SEED AND SENTINEL LYMPH NODE BIOPSY Right 10/18/2022   Procedure: RIGHT BREAST SEED LUMPECTOMY, RIGHT SENTINEL LYMPH NODE MAPPING;  Surgeon: Vanderbilt Ned, MD;  Location: [facility];  Service: General;  Laterality: Right;  PEC BLOCK   DILITATION & CURRETTAGE/HYSTROSCOPY WITH NOVASURE ABLATION N/A 11/24/2014   Procedure: Hysteroscopy DILATATION & CURETTAGE  WITH Failed  NOVASURE ABLATION;  Surgeon: Percilla Burly, MD;  Location: [facility];  Service: Gynecology;  Laterality: N/A;   UPPER GI ENDOSCOPY     WRIST GANGLION EXCISION     Right     ALLERGIES:  Allergies  Allergen Reactions   Codeine Itching   Doxycycline  Other (See Comments)    Caused ulcers     CURRENT MEDICATIONS:  Outpatient Encounter Medications as of 12/06/2023  Medication Sig   albuterol (VENTOLIN HFA) 108 (90 Base) MCG/ACT inhaler    ALPRAZolam  (XANAX ) 0.25 MG tablet Take 1 tablet (0.25 mg total) by mouth 3 (three) times daily as needed for anxiety.   cyclobenzaprine  (FLEXERIL ) 10 MG tablet Take 1 tablet (10 mg total) by mouth 3 (three) times daily as needed for muscle spasms.   gabapentin  (NEURONTIN ) 300 MG capsule Take 1 capsule (300 mg total) by mouth at bedtime.   ibuprofen (ADVIL) 800 MG tablet Take 800 mg by mouth.   meclizine  (ANTIVERT ) 25 MG tablet Take 1 tablet (25 mg total) by mouth 3 (three) times daily as needed for dizziness.   tamoxifen  (NOLVADEX ) 20 MG tablet Take 1 tablet (20 mg total) by mouth daily.   vitamin B-12 (CYANOCOBALAMIN ) 100 MCG tablet Take 100 mcg by mouth daily.   No facility-administered encounter medications on file as of 12/06/2023.     ONCOLOGIC FAMILY HISTORY:  Family History  Problem Relation Age of Onset   Prostate  cancer Father 62   Stomach cancer Father 64   Lung cancer Maternal Grandfather        dx >50   Lung cancer Paternal Grandmother        dx > 50; smoking hx   Prostate cancer Paternal Grandfather        dx 42s   Diabetes Other        1st degree relative   Hypertension Other      SOCIAL HISTORY:  Social History   Socioeconomic History   Marital status: Married    Spouse name: Not on file   Number of children: Not on file   Years of education: Not on file   Highest education level: Not on file  Occupational History   Occupation: financial services    Employer: [employer]  Tobacco Use   Smoking status: Former    Current packs/day: 0.10    Average packs/day: 0.1 packs/day for 20.0 years (2.0 ttl pk-yrs)    Types: Cigarettes   Smokeless tobacco: Never  Vaping Use   Vaping status: Never Used  Substance and Sexual Activity   Alcohol use: Yes   Drug use: No   Sexual activity: Yes  Birth control/protection: Injection  Other Topics Concern   Not on file  Social History Narrative   Not on file   Social Drivers of Health   Financial Resource Strain: Low Risk  (07/26/2023)   Received from [facility]   Overall Financial Resource Strain (CARDIA)    How hard is it for you to pay for the very basics like food, housing, medical care, and heating?: Not very hard  Food Insecurity: No Food Insecurity (07/26/2023)   Received from [facility]   Hunger Vital Sign    Within the past 12 months, you worried that your food would run out before you got the money to buy more.: Never true    Within the past 12 months, the food you bought just didn't last and you didn't have money to get more.: Never true  Transportation Needs: No Transportation Needs (07/26/2023)   Received from [facility] - Transportation    In the past 12 months, has lack of transportation kept you from medical appointments or from getting medications?: No    In the past 12 months, has lack of  transportation kept you from meetings, work, or from getting things needed for daily living?: No  Physical Activity: Sufficiently Active (07/26/2023)   Received from [facility]   Exercise Vital Sign    On average, how many days per week do you engage in moderate to strenuous exercise (like a brisk walk)?: 3 days    On average, how many minutes do you engage in exercise at this level?: 60 min  Stress: Stress Concern Present (07/26/2023)   Received from [facility] - Occupational Stress Questionnaire    Do you feel stress - tense, restless, nervous, or anxious, or unable to sleep at night because your mind is troubled all the time - these days?: Rather much  Social Connections: Socially Integrated (07/26/2023)   Received from [facility]   Social Network    How would you rate your social network (family, work, friends)?: Good participation with social networks  Intimate Partner Violence: Not At Risk (07/26/2023)   Received from [facility]   HITS    Over the last 12 months how often did your partner physically hurt you?: Never    Over the last 12 months how often did your partner insult you or talk down to you?: Never    Over the last 12 months how often did your partner threaten you with physical harm?: Never    Over the last 12 months how often did your partner scream or curse at you?: Never     OBSERVATIONS/OBJECTIVE:   BP 118/76   Pulse 84   Temp 98.2 F (36.8 C) (Temporal)   Resp 18   Wt 129 lb 11.2 oz (58.8 kg)   SpO2 99%   BMI 23.72 kg/m   GENERAL: Patient is a well appearing female in no acute distress HEENT:  Sclerae anicteric.  Oropharynx clear and moist. No ulcerations or evidence of oropharyngeal candidiasis. Neck is supple.  NODES:  No cervical, supraclavicular, or axillary lymphadenopathy palpated.  BREAST EXAM:  Right breast s/p lumpectomy and radiation, no sign of local recurrence, left breast benign EXTREMITIES:   No peripheral edema.    LABORATORY DATA:  None for this visit.  DIAGNOSTIC IMAGING:  None for this visit.   Malignant neoplasm of upper-outer quadrant of right breast in female, estrogen receptor positive (HCC) This is a very pleasant 53 year old  premenopausal female patient with newly diagnosed right breast grade 3 IDC, ER positive PR positive HER2 nonamplified, Ki-67 of 30% status post lumpectomy, Oncotype DX of 27 referred to medical oncology for additional recommendations.  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 HER2 negative. Oncotype DX score of 27, about 16% risk of distant recurrence at 9 years without chemotherapy.  We do agree with the chemotherapy recommendations with [facility] team, I did recommend docetaxel and cyclophosphamide every 21 days for 4 cycles Discussed the benefits and risks of chemotherapy, radiation, and anti-estrogen therapy. Recommend chemotherapy with Docetaxel and Cyclophosphamide every 21 days for 4 cycles. After much consideration, pt wanted to omit chemotherapy. She completed adjuvant radiation and is currently on OFS with tamoxifen .  Assessment and Plan Assessment & Plan Insomnia and fatigue associated with menopausal vasomotor symptoms Persistent insomnia and fatigue likely exacerbated by menopausal vasomotor symptoms. Gabapentin  may help with vasomotor symptoms and improve sleep. Discussed potential side effects, primarily sleepiness. - Prescribed gabapentin  at night for vasomotor symptoms and sleep improvement. - Advised trial of gabapentin  for at least two weeks before discontinuing. - Recommended meditation using the Calm app to aid sleep.  Post-surgical right breast pain Intermittent shooting pains likely due to nerve regeneration post-surgery. Mammogram results normal.  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 Gabapentin  may provide some relief. - Refer to physical therapy for specific exercises. She says she can't make it to the physical therapy at this time  - She will let us  know when she is ready to go to PT  Melasma (facial hyperpigmentation) Facial hyperpigmentation likely due to hormonal changes associated with menopause.    The patient was provided an opportunity to ask questions and all were answered. The patient agreed with the plan and demonstrated an understanding of the instructions.   Total encounter time:30 minutes*in face-to-face visit time, chart review, lab review, care coordination, order entry, and documentation of the encounter time.   *Total Encounter Time as defined by the Centers for Medicare and Medicaid Services includes, in addition to the face-to-face time of a patient visit (documented in the note above) non-face-to-face time: obtaining and reviewing outside history, ordering and reviewing medications, tests or procedures, care coordination (communications with other health care professionals or caregivers) and documentation in the medical record.

## 2023-12-06 NOTE — Progress Notes (Signed)
 BRIEF ONCOLOGIC HISTORY:  Oncology History  Malignant neoplasm of upper-outer quadrant of right breast in female, estrogen receptor positive (HCC)  09/12/2022 Mammogram   Screening mammogram showed possible mass in the upper outer right breast measuring about a centimeter at 10:00 4 cm from the nipple, sonographic imaging of the right axilla demonstrates normal lymph nodes.  No enlarged or abnormal lymph nodes.   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   12/04/2022 Cancer Staging   Staging form: Breast, AJCC 8th Edition - Pathologic stage from 12/04/2022: Stage IA (pT1c, pN0, cM0, G3, ER+, PR+, HER2-) - Signed by Loretha Ash, MD on 12/10/2022 Stage prefix: Initial diagnosis Method of lymph node assessment: Axillary lymph node dissection Multigene prognostic tests performed: Oncotype DX Histologic grading system: 3 grade system   12/31/2022 - 01/29/2023 Radiation Therapy   Plan Name: Breast_R Site: Breast, Right Technique: 3D Mode: Photon Dose Per Fraction: 2.67 Gy Prescribed Dose (Delivered / Prescribed): 40.05 Gy / 40.05 Gy Prescribed Fxs (Delivered / Prescribed): 15 / 15   Plan Name: Breast_R_Bst Site: Breast, Right Technique: Electron Mode: Electron Dose Per Fraction: 2 Gy Prescribed Dose (Delivered / Prescribed): 10 Gy / 10 Gy Prescribed Fxs (Delivered / Prescribed): 5 / 5   02/2023 -  Anti-estrogen oral therapy   Tamoxifen      INTERVAL HISTORY:   Discussed the use of AI scribe software for clinical note transcription with the patient, who gave verbal consent to proceed.  History of Present Illness Candace Espinoza is a 53 year old female with breast cancer who presents for follow-up regarding her treatment with tamoxifen  and Zoladex . She is undergoing treatment for breast cancer with tamoxifen  and Zoladex .      REVIEW OF SYSTEMS:  Review of Systems  Constitutional:  Negative for appetite change, chills, fatigue, fever and unexpected weight change.  HENT:   Negative for hearing loss, lump/mass and trouble swallowing.   Eyes:  Negative for eye problems and icterus.  Respiratory:  Negative for chest tightness, cough and shortness of breath.   Cardiovascular:  Negative for chest pain, leg swelling and palpitations.  Gastrointestinal:  Negative for abdominal distention, abdominal pain, constipation, diarrhea, nausea and vomiting.  Endocrine: Positive for hot flashes.  Genitourinary:  Negative for difficulty urinating.   Musculoskeletal:  Negative for arthralgias.  Skin:  Negative for itching and rash.  Neurological:  Negative for dizziness, extremity weakness, headaches and numbness.  Hematological:  Negative for adenopathy. Does not bruise/bleed easily.  Psychiatric/Behavioral:  Negative for depression. The patient is not nervous/anxious.      PAST MEDICAL/SURGICAL HISTORY:  Past Medical History:  Diagnosis Date   Anemia    Vitamin B-12 Deficiency   Anxiety    Breast cancer (HCC)    Esophagitis    GERD (gastroesophageal reflux disease)    Personal history of radiation therapy    Past Surgical History:  Procedure Laterality Date   BREAST BIOPSY Right 09/17/2022   US  RT BREAST BX W LOC DEV 1ST LESION IMG BX SPEC US  GUIDE 09/17/2022  [facility]   BREAST BIOPSY  10/17/2022   MM RT RADIOACTIVE SEED LOC MAMMO GUIDE 10/17/2022 [facility]   BREAST LUMPECTOMY WITH RADIOACTIVE SEED AND SENTINEL LYMPH NODE BIOPSY  Right 10/18/2022   Procedure: RIGHT BREAST SEED LUMPECTOMY, RIGHT SENTINEL LYMPH NODE MAPPING;  Surgeon: Vanderbilt Ned, MD;  Location: [facility];  Service: General;  Laterality: Right;  PEC BLOCK   DILITATION & CURRETTAGE/HYSTROSCOPY WITH NOVASURE ABLATION N/A 11/24/2014   Procedure: Hysteroscopy DILATATION & CURETTAGE  WITH Failed  NOVASURE ABLATION;  Surgeon: Percilla Burly, MD;  Location: [facility];  Service: Gynecology;  Laterality: N/A;   UPPER GI ENDOSCOPY     WRIST GANGLION EXCISION     Right     ALLERGIES:  Allergies  Allergen Reactions   Codeine Itching   Doxycycline  Other (See Comments)    Caused ulcers     CURRENT MEDICATIONS:  Outpatient Encounter Medications as of 12/06/2023  Medication Sig   albuterol (VENTOLIN HFA) 108 (90 Base) MCG/ACT inhaler    ALPRAZolam  (XANAX ) 0.25 MG tablet Take 1 tablet (0.25 mg total) by mouth 3 (three) times daily as needed for anxiety.   cyclobenzaprine  (FLEXERIL ) 10 MG tablet Take 1 tablet (10 mg total) by mouth 3 (three) times daily as needed for muscle spasms.   ibuprofen (ADVIL) 800 MG tablet Take 800 mg by mouth.   meclizine  (ANTIVERT ) 25 MG tablet Take 1 tablet (25 mg total) by mouth 3 (three) times daily as needed for dizziness.   tamoxifen  (NOLVADEX ) 20 MG tablet Take 1 tablet (20 mg total) by mouth daily.   vitamin B-12 (CYANOCOBALAMIN ) 100 MCG tablet Take 100 mcg by mouth daily.   No facility-administered encounter medications on file as of 12/06/2023.     ONCOLOGIC FAMILY HISTORY:  Family History  Problem Relation Age of Onset   Prostate cancer Father 92   Stomach cancer Father 74   Lung cancer Maternal Grandfather        dx >50   Lung cancer Paternal Grandmother        dx > 50; smoking hx   Prostate cancer Paternal Grandfather        dx 59s   Diabetes Other        1st degree relative   Hypertension Other      SOCIAL HISTORY:  Social History   Socioeconomic History   Marital status: Married     Spouse name: Not on file   Number of children: Not on file   Years of education: Not on file   Highest education level: Not on file  Occupational History   Occupation: financial services    Employer: [employer]  Tobacco Use   Smoking status: Former    Current packs/day: 0.10    Average packs/day: 0.1 packs/day for 20.0 years (2.0 ttl pk-yrs)    Types: Cigarettes   Smokeless tobacco: Never  Vaping Use   Vaping status: Never Used  Substance and Sexual Activity   Alcohol use: Yes   Drug use: No   Sexual activity: Yes    Birth control/protection: Injection  Other Topics Concern   Not on file  Social History Narrative   Not on file   Social Drivers of Health   Financial Resource Strain: Low Risk  (07/26/2023)   Received from [facility]   Overall Financial Resource Strain (CARDIA)    How hard is it for you to pay for the very basics like food, housing, medical care, and heating?: Not  very hard  Food Insecurity: No Food Insecurity (07/26/2023)   Received from [facility]   Hunger Vital Sign    Within the past 12 months, you worried that your food would run out before you got the money to buy more.: Never true    Within the past 12 months, the food you bought just didn't last and you didn't have money to get more.: Never true  Transportation Needs: No Transportation Needs (07/26/2023)   Received from [facility] - Transportation    In the past 12 months, has lack of transportation kept you from medical appointments or from getting medications?: No    In the past 12 months, has lack of transportation kept you from meetings, work, or from getting things needed for daily living?: No  Physical Activity: Sufficiently Active (07/26/2023)   Received from [facility]   Exercise Vital Sign    On average, how many days per week do you engage in moderate to strenuous exercise (like a brisk walk)?: 3 days    On average, how many minutes do you engage in exercise at  this level?: 60 min  Stress: Stress Concern Present (07/26/2023)   Received from [facility] - Occupational Stress Questionnaire    Do you feel stress - tense, restless, nervous, or anxious, or unable to sleep at night because your mind is troubled all the time - these days?: Rather much  Social Connections: Socially Integrated (07/26/2023)   Received from [facility]   Social Network    How would you rate your social network (family, work, friends)?: Good participation with social networks  Intimate Partner Violence: Not At Risk (07/26/2023)   Received from [facility]   HITS    Over the last 12 months how often did your partner physically hurt you?: Never    Over the last 12 months how often did your partner insult you or talk down to you?: Never    Over the last 12 months how often did your partner threaten you with physical harm?: Never    Over the last 12 months how often did your partner scream or curse at you?: Never     OBSERVATIONS/OBJECTIVE:  There were no vitals taken for this visit. GENERAL: Patient is a well appearing female in no acute distress HEENT:  Sclerae anicteric.  Oropharynx clear and moist. No ulcerations or evidence of oropharyngeal candidiasis. Neck is supple.  NODES:  No cervical, supraclavicular, or axillary lymphadenopathy palpated.  BREAST EXAM:  Right breast s/p lumpectomy and radiation, no sign of local recurrence, left breast benign LUNGS:  Clear to auscultation bilaterally.  No wheezes or rhonchi. HEART:  Regular rate and rhythm. No murmur appreciated. ABDOMEN:  Soft, nontender.  Positive, normoactive bowel sounds. No organomegaly palpated. MSK:  No focal spinal tenderness to palpation. Full range of motion bilaterally in the upper extremities. EXTREMITIES:  No peripheral edema.   SKIN:  Clear with no obvious rashes or skin changes. No nail dyscrasia. NEURO:  Nonfocal. Well oriented.  Appropriate  affect.   LABORATORY DATA:  None for this visit.  DIAGNOSTIC IMAGING:  None for this visit.   No problem-specific Assessment & Plan notes found for this encounter.     The patient was provided an opportunity to ask questions and all were answered. The patient agreed with the plan and demonstrated an understanding of the instructions.   Total encounter time:30 minutes*in face-to-face visit time, chart review, lab  review, care coordination, order entry, and documentation of the encounter time.   *Total Encounter Time as defined by the Centers for Medicare and Medicaid Services includes, in addition to the face-to-face time of a patient visit (documented in the note above) non-face-to-face time: obtaining and reviewing outside history, ordering and reviewing medications, tests or procedures, care coordination (communications with other health care professionals or caregivers) and documentation in the medical record.

## 2024-01-03 ENCOUNTER — Inpatient Hospital Stay: Attending: Radiation Oncology

## 2024-01-03 VITALS — BP 119/83 | HR 76 | Temp 97.8°F | Resp 17

## 2024-01-03 DIAGNOSIS — Z1732 Human epidermal growth factor receptor 2 negative status: Secondary | ICD-10-CM | POA: Insufficient documentation

## 2024-01-03 DIAGNOSIS — Z5111 Encounter for antineoplastic chemotherapy: Secondary | ICD-10-CM | POA: Insufficient documentation

## 2024-01-03 DIAGNOSIS — Z1721 Progesterone receptor positive status: Secondary | ICD-10-CM | POA: Diagnosis not present

## 2024-01-03 DIAGNOSIS — Z170 Estrogen receptor positive status [ER+]: Secondary | ICD-10-CM | POA: Insufficient documentation

## 2024-01-03 DIAGNOSIS — C50411 Malignant neoplasm of upper-outer quadrant of right female breast: Secondary | ICD-10-CM | POA: Insufficient documentation

## 2024-01-03 DIAGNOSIS — Z79899 Other long term (current) drug therapy: Secondary | ICD-10-CM | POA: Insufficient documentation

## 2024-01-03 MED ORDER — GOSERELIN ACETATE 3.6 MG ~~LOC~~ IMPL
3.6000 mg | DRUG_IMPLANT | Freq: Once | SUBCUTANEOUS | Status: AC
  Administered 2024-01-03: 3.6 mg via SUBCUTANEOUS
  Filled 2024-01-03: qty 3.6

## 2024-01-22 ENCOUNTER — Other Ambulatory Visit: Payer: Self-pay | Admitting: Hematology and Oncology

## 2024-01-31 ENCOUNTER — Inpatient Hospital Stay

## 2024-01-31 VITALS — BP 121/86 | HR 95 | Temp 98.4°F | Resp 18

## 2024-01-31 DIAGNOSIS — Z5111 Encounter for antineoplastic chemotherapy: Secondary | ICD-10-CM | POA: Diagnosis not present

## 2024-01-31 DIAGNOSIS — Z170 Estrogen receptor positive status [ER+]: Secondary | ICD-10-CM

## 2024-01-31 MED ORDER — GOSERELIN ACETATE 3.6 MG ~~LOC~~ IMPL
3.6000 mg | DRUG_IMPLANT | Freq: Once | SUBCUTANEOUS | Status: AC
  Administered 2024-01-31: 3.6 mg via SUBCUTANEOUS
  Filled 2024-01-31: qty 3.6

## 2024-02-06 ENCOUNTER — Encounter: Payer: Self-pay | Admitting: Hematology and Oncology

## 2024-02-28 ENCOUNTER — Inpatient Hospital Stay: Attending: Radiation Oncology

## 2024-03-27 ENCOUNTER — Inpatient Hospital Stay: Attending: Radiation Oncology

## 2024-04-24 ENCOUNTER — Inpatient Hospital Stay: Attending: Radiation Oncology

## 2024-05-22 ENCOUNTER — Inpatient Hospital Stay: Attending: Radiation Oncology

## 2024-06-19 ENCOUNTER — Inpatient Hospital Stay: Attending: Radiation Oncology

## 2024-06-19 ENCOUNTER — Inpatient Hospital Stay: Admitting: Hematology and Oncology

## 2024-07-17 ENCOUNTER — Inpatient Hospital Stay: Attending: Radiation Oncology

## 2024-08-14 ENCOUNTER — Inpatient Hospital Stay: Attending: Radiation Oncology

## 2024-09-11 ENCOUNTER — Inpatient Hospital Stay: Attending: Radiation Oncology

## 2024-10-09 ENCOUNTER — Inpatient Hospital Stay: Attending: Radiation Oncology

## 2024-11-06 ENCOUNTER — Inpatient Hospital Stay: Attending: Radiation Oncology

## 2024-12-04 ENCOUNTER — Inpatient Hospital Stay

## 2024-12-04 ENCOUNTER — Inpatient Hospital Stay: Attending: Radiation Oncology | Admitting: Hematology and Oncology
